# Patient Record
Sex: Female | Born: 1950 | Race: White | Hispanic: No | Smoking: Never smoker
Health system: Southern US, Community
[De-identification: ages and names within clinical notes are randomized; demographics above are authoritative.]

## PROBLEM LIST (undated history)

## (undated) DIAGNOSIS — I10 Essential (primary) hypertension: Secondary | ICD-10-CM

## (undated) HISTORY — PX: MYRINGOPLASTY: SUR873

## (undated) HISTORY — PX: OTHER SURGICAL HISTORY: SHX169

## (undated) HISTORY — PX: CEREBRAL EMBOLIZATION: SHX1327

## (undated) HISTORY — PX: HERNIA REPAIR: SHX51

---

## 1982-12-29 HISTORY — PX: DILATION AND CURETTAGE OF UTERUS: SHX78

## 2000-10-30 ENCOUNTER — Encounter: Admission: RE | Admit: 2000-10-30 | Discharge: 2001-01-28 | Payer: Self-pay | Admitting: Family Medicine

## 2004-02-16 ENCOUNTER — Encounter (INDEPENDENT_AMBULATORY_CARE_PROVIDER_SITE_OTHER): Payer: Self-pay | Admitting: *Deleted

## 2004-02-16 ENCOUNTER — Ambulatory Visit (HOSPITAL_COMMUNITY): Admission: RE | Admit: 2004-02-16 | Discharge: 2004-02-16 | Payer: Self-pay | Admitting: Gastroenterology

## 2004-12-26 ENCOUNTER — Other Ambulatory Visit: Admission: RE | Admit: 2004-12-26 | Discharge: 2004-12-26 | Payer: Self-pay | Admitting: Family Medicine

## 2006-01-02 ENCOUNTER — Other Ambulatory Visit: Admission: RE | Admit: 2006-01-02 | Discharge: 2006-01-02 | Payer: Self-pay | Admitting: Family Medicine

## 2007-01-22 ENCOUNTER — Other Ambulatory Visit: Admission: RE | Admit: 2007-01-22 | Discharge: 2007-01-22 | Payer: Self-pay | Admitting: Family Medicine

## 2008-02-04 ENCOUNTER — Other Ambulatory Visit: Admission: RE | Admit: 2008-02-04 | Discharge: 2008-02-04 | Payer: Self-pay | Admitting: Family Medicine

## 2009-02-09 ENCOUNTER — Other Ambulatory Visit: Admission: RE | Admit: 2009-02-09 | Discharge: 2009-02-09 | Payer: Self-pay | Admitting: Family Medicine

## 2010-02-22 ENCOUNTER — Other Ambulatory Visit: Admission: RE | Admit: 2010-02-22 | Discharge: 2010-02-22 | Payer: Self-pay | Admitting: Family Medicine

## 2010-11-15 ENCOUNTER — Encounter
Admission: RE | Admit: 2010-11-15 | Discharge: 2010-11-15 | Payer: Self-pay | Source: Home / Self Care | Admitting: Otolaryngology

## 2010-12-20 ENCOUNTER — Ambulatory Visit (HOSPITAL_COMMUNITY)
Admission: RE | Admit: 2010-12-20 | Discharge: 2010-12-20 | Payer: Self-pay | Source: Home / Self Care | Attending: Family Medicine | Admitting: Family Medicine

## 2010-12-27 ENCOUNTER — Ambulatory Visit (HOSPITAL_COMMUNITY)
Admission: RE | Admit: 2010-12-27 | Discharge: 2010-12-27 | Payer: Self-pay | Source: Home / Self Care | Attending: Interventional Radiology | Admitting: Interventional Radiology

## 2011-01-13 LAB — SURGICAL PCR SCREEN
MRSA, PCR: NEGATIVE
Staphylococcus aureus: POSITIVE — AB

## 2011-01-13 LAB — APTT: aPTT: 28 seconds (ref 24–37)

## 2011-01-13 LAB — BASIC METABOLIC PANEL
BUN: 11 mg/dL (ref 6–23)
CO2: 31 mEq/L (ref 19–32)
Calcium: 9.9 mg/dL (ref 8.4–10.5)
Chloride: 98 mEq/L (ref 96–112)
Creatinine, Ser: 0.81 mg/dL (ref 0.4–1.2)
GFR calc Af Amer: >60 mL/min (ref >60)
GFR calc non Af Amer: >60 mL/min (ref >60)
Glucose, Bld: 92 mg/dL (ref 70–99)
Potassium: 3.7 mEq/L (ref 3.5–5.1)
Sodium: 139 mEq/L (ref 135–145)

## 2011-01-13 LAB — CBC
HCT: 44 % (ref 36.0–46.0)
Hemoglobin: 14.6 g/dL (ref 12.0–15.0)
MCH: 28.1 pg (ref 26.0–34.0)
MCHC: 33.2 g/dL (ref 30.0–36.0)
MCV: 84.6 fL (ref 78.0–100.0)
Platelets: 278 10*3/uL (ref 150–400)
RBC: 5.2 MIL/uL — ABNORMAL HIGH (ref 3.87–5.11)
RDW: 13.5 % (ref 11.5–15.5)
WBC: 10.4 10*3/uL (ref 4.0–10.5)

## 2011-01-13 LAB — PROTIME-INR
INR: 1 (ref 0.00–1.49)
Prothrombin Time: 13.4 seconds (ref 11.6–15.2)

## 2011-01-15 ENCOUNTER — Ambulatory Visit (HOSPITAL_COMMUNITY)
Admission: RE | Admit: 2011-01-15 | Discharge: 2011-01-16 | Payer: Self-pay | Source: Home / Self Care | Attending: Interventional Radiology | Admitting: Interventional Radiology

## 2011-01-16 ENCOUNTER — Other Ambulatory Visit (HOSPITAL_COMMUNITY): Payer: Self-pay | Admitting: Interventional Radiology

## 2011-01-16 DIAGNOSIS — I770 Arteriovenous fistula, acquired: Secondary | ICD-10-CM

## 2011-01-20 LAB — CBC
HCT: 37.4 % (ref 36.0–46.0)
Hemoglobin: 12.3 g/dL (ref 12.0–15.0)
MCH: 27.6 pg (ref 26.0–34.0)
MCHC: 32.9 g/dL (ref 30.0–36.0)
MCV: 83.9 fL (ref 78.0–100.0)
Platelets: 234 10*3/uL (ref 150–400)
RBC: 4.46 MIL/uL (ref 3.87–5.11)
RDW: 13.6 % (ref 11.5–15.5)
WBC: 9.2 10*3/uL (ref 4.0–10.5)

## 2011-01-20 LAB — BASIC METABOLIC PANEL
BUN: 8 mg/dL (ref 6–23)
CO2: 28 mEq/L (ref 19–32)
Calcium: 9 mg/dL (ref 8.4–10.5)
Chloride: 102 mEq/L (ref 96–112)
Creatinine, Ser: 0.83 mg/dL (ref 0.4–1.2)
GFR calc Af Amer: >60 mL/min (ref >60)
GFR calc non Af Amer: >60 mL/min (ref >60)
Glucose, Bld: 116 mg/dL — ABNORMAL HIGH (ref 70–99)
Potassium: 4.2 mEq/L (ref 3.5–5.1)
Sodium: 143 mEq/L (ref 135–145)

## 2011-01-20 LAB — APTT: aPTT: 39 seconds — ABNORMAL HIGH (ref 24–37)

## 2011-01-20 LAB — PROTIME-INR
INR: 1.02 (ref 0.00–1.49)
Prothrombin Time: 13.6 seconds (ref 11.6–15.2)

## 2011-01-20 LAB — HEPARIN LEVEL (UNFRACTIONATED): Heparin Unfractionated: 0.17 IU/mL — ABNORMAL LOW (ref 0.30–0.70)

## 2011-01-20 NOTE — H&P (Signed)
Katie King, Katie King                 ACCOUNT NO.:  [id number]  MEDICAL RECORD NO.:  [id number]           PATIENT TYPE:  LOCATION:                                 FACILITY:  PHYSICIAN:  Lyana Asbill K. Satya Buttram, M.D.DATE OF BIRTH:  08/31/1951  DATE OF ADMISSION:  01/15/2011 DATE OF DISCHARGE:                             HISTORY & PHYSICAL   CHIEF COMPLAINT:  Dural AV fistula.  BRIEF HISTORY:  This is a pleasant 60 year old female who was referred to Dr. Corliss Skains through the courtesy of Dr. Suzanna Obey who had seen the patient for evaluation of right sided hearing loss.  The patient had a CT examination that showed a possible fistula.  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 She was seen in consultation by Dr. Corliss Skains on December 27, 2010.  At that time treatment options were discussed along with the risks, benefits, and alternatives.  The patient is admitted to [facility] today for embolization of the dural AV fistula.  PAST MEDICAL HISTORY:  Significant for hypertension.  The patient has had decreased hearing in her right ear recently and a sensation as though the ear was stopped up.  She has also noted some mild pulsatile tinnitus.  SURGICAL HISTORY:  The patient had a hernia repair at age 55.  She had a D&C 25 years ago.  ALLERGIES:  She is intolerant to CODEINE and SULFA secondary to nausea.  CURRENT MEDICATIONS:  The patient recently completed a course of azithromycin for a sinus infection.  She uses over-the-counter GenTeal eye drops.  She takes Tylenol p.r.n.  She takes hydrochlorothiazide 12.5 mg daily for hypertension, Claritin 10 mg daily p.r.n., amlodipine 10 mg daily, benazepril 20 mg daily, and she  was recently placed on aspirin.  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 Her husband is on disability from [place].  They have 1 daughter.  The patient does not smoke or use alcohol.  REVIEW OF SYSTEMS:  Negative except for the following.  The patient has had a nonproductive cough which she feels is secondary to a recent sinus infection, although the sinus infection has been treated with a course of Zithromax.  She has had the above-noted right ear problems with pulsatile tinnitus and a blocked sensation as well as decreased hearing in that ear.  The remainder of the review of systems was negative.  LABORATORY DATA:  An INR was 1.0.  Protime was 13.4, PTT was 28, BUN was 11, creatinine 0.81, potassium 3.7, GFR was greater than 60.  A presurgical screen was positive for staph aureus.  A CBC revealed hemoglobin 14.6, hematocrit 44, WBCs 10.4 thousand, platelets 278,000.  PHYSICAL EXAMINATION:  GENERAL:  Revealed a pleasant 60 year old white female in no acute distress. VITAL SIGNS:  Blood pressure 132/84, pulse 107, respirations 18, temperature 98.1, oxygen saturation 99% on room air. HEENT:  Unremarkable. HEART:  Revealed regular rate and rhythm without murmur. LUNGS:  Clear. ABDOMEN:  Soft, nontender. EXTREMITIES:  Revealed pulses to be intact without significant edema. Her airway was rated at a 1.  Her ASA scale was rated at a 2. NEUROLOGICAL:  Revealed the patient to be alert and oriented and able to follow 3-step commands.  Cranial nerves II-XII were grossly intact except for decreased hearing in the right ear.  Sensation was intact to light touch.  Motor strength is 5/5 throughout.  Cerebellar testing was intact.  IMPRESSION: 1. 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 2. History of decreased hearing in the right ear with pulsatile     tinnitus. 3. History of hypertension. 4. Positive staph aureus screen. 5. Recent sinus infection - treated. 6. Intolerance to codeine and sulfa drugs.  PLAN:  The patient will undergo endovascular embolization of the AV dural fistula today to be performed by Dr. Corliss Skains under general anesthesia.     Delton See, P.A.   ______________________________ Grandville Silos. Corliss Skains, M.D.    DR/MEDQ  D:  01/15/2011  T:  01/15/2011  Job:  [id number]  cc:   Caryn Bee L. Little, M.D. Suzanna Obey, M.D.  Electronically Signed by Delton See P.A. on 01/17/2011 12:13:22 PM Electronically Signed by Julieanne Cotton M.D. on 01/20/2011 11:23:05 AM

## 2011-01-23 LAB — POCT ACTIVATED CLOTTING TIME
Activated Clotting Time: 169 seconds
Activated Clotting Time: 164 seconds
Activated Clotting Time: 169 seconds
Activated Clotting Time: 169 seconds

## 2011-01-29 ENCOUNTER — Ambulatory Visit (HOSPITAL_COMMUNITY)
Admission: RE | Admit: 2011-01-29 | Discharge: 2011-01-29 | Disposition: A | Discharge status: Home / Self Care | Payer: 59 | Source: Ambulatory Visit | Attending: Interventional Radiology | Admitting: Interventional Radiology

## 2011-01-29 DIAGNOSIS — I770 Arteriovenous fistula, acquired: Secondary | ICD-10-CM

## 2011-01-29 NOTE — Discharge Summary (Signed)
Katie King, Katie King                 ACCOUNT NO.:  [id number]  MEDICAL RECORD NO.:  [id number]          PATIENT TYPE:  OIB  LOCATION:  3113                         FACILITY:  [facility]  PHYSICIAN:  Niasha Devins K. Abimael Zeiter, M.D.DATE OF BIRTH:  1951-07-28  DATE OF ADMISSION:  01/15/2011 DATE OF DISCHARGE:  01/16/2011                              DISCHARGE SUMMARY   CHIEF COMPLAINT:  Status post endovascular embolization for a dural AV fistula, performed on January 15, 2011.  BRIEF HISTORY:  This is a pleasant 59 year old female who was referred to Dr. Corliss Skains through the courtesy of Dr. Suzanna Obey who had evaluated the patient for hearing loss.  A CT scan revealed a possible fistula.  A cerebral angiogram was performed on December 20, 2010, and revealed a prominent fast-flowing dural AV fistula involving the left transverse sigmoid sinuses.  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  The patient was seen in consultation by Dr. Corliss Skains on December 27, 2010.  At that time, treatment options were discussed along with the risks, benefits, and alternatives.  The patient agreed to proceed and was admitted to [facility] on January 15, 2011, for intervention.  PAST MEDICAL HISTORY:  Significant for: 1. Hypertension. 2. Decreased hearing in her right ear and sensation of fullness in her     right ear with mild pulsatile tinnitus.  PAST SURGICAL HISTORY:  The patient had a hernia repair at age 23.  She had a D and C 25 years ago.  ALLERGIES:  She is intolerant to CODEINE and SULFA secondary to nausea. She has a sensitivity to LATEX.  MEDICATIONS: 1. Amlodipine 10 mg daily. 2. Benazepril 20 mg daily. 3. Loratadine 10 mg p.r.n. 4. Hydrochlorothiazide 12.5 mg daily. 5. Tylenol p.r.n. 6. GenTeal eyedrops over the counter p.r.n. 7. Refresh Tears over the counter p.r.n. 8. She patient  recently finished a course of azithromycin 250 mg daily.   9. She received nimodipine and aspirin on the day of the procedure.  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  They have one daughter.  The patient does not smoke or use alcohol.  She lives in [place].  HOSPITAL COURSE:  This patient was admitted to [facility] on January 15, 2011, to undergo endovascular embolization of a dural AV fistula.  The procedure was performed on the day of admission by Dr. Corliss Skains under general anesthesia using the Onyx embolization method. Dr. Corliss Skains felt that there was an excellent result, and there were no obvious or immediate complications.  He did feel that the patient might require further treatment in the future;  however, there is also a possibility that this will be the only treatment required.  Following the intervention, the patient was admitted to the neuro intensive care unit where she remained stable overnight.  The following day, the patient was doing well.  She reported that she was no longer hearing the pulsatile tinnitus. The hearing had improved somewhat  in her right ear although it was still decreased compared to the left.  Dr. Corliss Skains recommended a cerebral angiogram in 3 months with intent to possibly treat further; although, he would not know whether or not further treatment was required until the angiogram was performed. The patient was discharged later that day in stable and improved condition.  LABORATORY DATA:  A CBC on the day of discharge revealed hemoglobin 12.3, hematocrit 37.4, WBCs were 9.2 thousand, platelets 234,000.  A basic metabolic panel revealed a BUN of 8, creatinine 0.83, potassium was 4.2, glucose was 116, GFR was greater than 60.  A chest x-ray performed prior to the intervention showed no acute or worrisome focal cardiopulmonary  abnormalities.  DISCHARGE MEDICATIONS:  The patient was discharged on the same medication she was on at the time of admission, please see the list as noted above.  The patient was given instructions regarding wound care.  She was told not to do anything strenuous for at least 2 weeks.  She was not to drive or return to work for at least 2 weeks.  The patient will follow up with Dr. Corliss Skains in 2 weeks.  She will see Dr. Catha Gosselin as needed or as scheduled.  DISCHARGE DIAGNOSES: 1. 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  The plan was for this      to be a staged procedure; however, further treatment may not be required. 2. History of decreased hearing in the right ear as well as possible     tinnitus which has somewhat improved following treatment. 3. History of hypertension. 4. Positive staph aureus screen at the time of admission which was     treated. 5. Recent sinus infection. 6. Intolerance to CODEINE and SULFA drugs as well as a sensitivity to     LATEX.  PLAN:  As noted, a cerebral angiogram will be performed in 3 months with possible further treatment.     Delton See, P.A.   ______________________________ Grandville Silos. Corliss Skains, M.D.    DR/MEDQ  D:  01/17/2011  T:  01/18/2011  Job:  [id number]  cc:   Suzanna Obey, M.D. Anna Genre Little, M.D.  Electronically Signed by Delton See P.A. on 01/21/2011 09:34:13 AM Electronically Signed by Julieanne Cotton M.D. on 01/29/2011 03:26:53 PM

## 2011-03-10 LAB — POCT I-STAT, CHEM 8
BUN: 18 mg/dL (ref 6–23)
Chloride: 103 mEq/L (ref 96–112)
Creatinine, Ser: 0.8 mg/dL (ref 0.4–1.2)
Hemoglobin: 15.3 g/dL — ABNORMAL HIGH (ref 12.0–15.0)
Potassium: 3.5 mEq/L (ref 3.5–5.1)
Sodium: 140 mEq/L (ref 135–145)

## 2011-03-10 LAB — CBC
HCT: 44.4 % (ref 36.0–46.0)
Hemoglobin: 15 g/dL (ref 12.0–15.0)
MCH: 28.6 pg (ref 26.0–34.0)
MCHC: 33.8 g/dL (ref 30.0–36.0)
MCV: 84.6 fL (ref 78.0–100.0)
RBC: 5.25 MIL/uL — ABNORMAL HIGH (ref 3.87–5.11)

## 2011-03-10 LAB — PROTIME-INR: Prothrombin Time: 13.1 seconds (ref 11.6–15.2)

## 2011-03-17 ENCOUNTER — Other Ambulatory Visit (HOSPITAL_COMMUNITY): Payer: Self-pay | Admitting: Interventional Radiology

## 2011-03-17 DIAGNOSIS — I770 Arteriovenous fistula, acquired: Secondary | ICD-10-CM

## 2011-03-28 ENCOUNTER — Other Ambulatory Visit (HOSPITAL_COMMUNITY)
Admission: RE | Admit: 2011-03-28 | Discharge: 2011-03-28 | Disposition: A | Discharge status: Home / Self Care | Payer: 59 | Source: Ambulatory Visit | Attending: Family Medicine | Admitting: Family Medicine

## 2011-03-28 ENCOUNTER — Other Ambulatory Visit: Payer: Self-pay | Admitting: Family Medicine

## 2011-03-28 DIAGNOSIS — Z124 Encounter for screening for malignant neoplasm of cervix: Secondary | ICD-10-CM | POA: Insufficient documentation

## 2011-03-28 DIAGNOSIS — Z1159 Encounter for screening for other viral diseases: Secondary | ICD-10-CM | POA: Insufficient documentation

## 2011-04-18 ENCOUNTER — Encounter (HOSPITAL_COMMUNITY)
Admission: RE | Admit: 2011-04-18 | Discharge: 2011-04-18 | Disposition: A | Discharge status: Home / Self Care | Payer: 59 | Source: Ambulatory Visit | Attending: Interventional Radiology | Admitting: Interventional Radiology

## 2011-04-18 LAB — APTT: aPTT: 24 seconds (ref 24–37)

## 2011-04-18 LAB — SURGICAL PCR SCREEN: Staphylococcus aureus: NEGATIVE

## 2011-04-18 LAB — BASIC METABOLIC PANEL
GFR calc non Af Amer: >60 mL/min (ref >60)
Potassium: 4.5 mEq/L (ref 3.5–5.1)
Sodium: 141 mEq/L (ref 135–145)

## 2011-04-18 LAB — DIFFERENTIAL
Basophils Absolute: 0.1 10*3/uL (ref 0.0–0.1)
Lymphocytes Relative: 28 % (ref 12–46)
Lymphs Abs: 2.2 10*3/uL (ref 0.7–4.0)
Monocytes Absolute: 0.4 10*3/uL (ref 0.1–1.0)
Neutro Abs: 5 10*3/uL (ref 1.7–7.7)

## 2011-04-18 LAB — CBC
HCT: 43.9 % (ref 36.0–46.0)
Hemoglobin: 14.7 g/dL (ref 12.0–15.0)
MCHC: 33.5 g/dL (ref 30.0–36.0)
MCV: 83.6 fL (ref 78.0–100.0)

## 2011-04-18 LAB — PROTIME-INR: INR: 0.97 (ref 0.00–1.49)

## 2011-04-23 ENCOUNTER — Inpatient Hospital Stay (HOSPITAL_COMMUNITY)
Admission: AD | Admit: 2011-04-23 | Discharge: 2011-04-24 | DRG: 027 | Disposition: A | Discharge status: Home / Self Care | Payer: 59 | Source: Ambulatory Visit | Attending: Interventional Radiology | Admitting: Interventional Radiology

## 2011-04-23 ENCOUNTER — Ambulatory Visit (HOSPITAL_COMMUNITY)
Admission: RE | Admit: 2011-04-23 | Discharge: 2011-04-23 | Disposition: A | Discharge status: Home / Self Care | Payer: 59 | Source: Ambulatory Visit | Attending: Interventional Radiology | Admitting: Interventional Radiology

## 2011-04-23 DIAGNOSIS — I770 Arteriovenous fistula, acquired: Secondary | ICD-10-CM

## 2011-04-23 DIAGNOSIS — M129 Arthropathy, unspecified: Secondary | ICD-10-CM | POA: Diagnosis present

## 2011-04-23 DIAGNOSIS — I729 Aneurysm of unspecified site: Secondary | ICD-10-CM

## 2011-04-23 DIAGNOSIS — I671 Cerebral aneurysm, nonruptured: Principal | ICD-10-CM | POA: Diagnosis present

## 2011-04-23 DIAGNOSIS — E669 Obesity, unspecified: Secondary | ICD-10-CM | POA: Diagnosis present

## 2011-04-23 DIAGNOSIS — I10 Essential (primary) hypertension: Secondary | ICD-10-CM | POA: Diagnosis present

## 2011-04-23 MED ORDER — IOHEXOL 300 MG/ML  SOLN: 210.0000 mL | Freq: Once | INTRAMUSCULAR | Status: AC | PRN

## 2011-04-24 LAB — BASIC METABOLIC PANEL
CO2: 28 mEq/L (ref 19–32)
Calcium: 8.6 mg/dL (ref 8.4–10.5)
Creatinine, Ser: 0.69 mg/dL (ref 0.4–1.2)
GFR calc Af Amer: >60 mL/min (ref >60)
GFR calc non Af Amer: >60 mL/min (ref >60)
Glucose, Bld: 110 mg/dL — ABNORMAL HIGH (ref 70–99)
Sodium: 140 mEq/L (ref 135–145)

## 2011-04-24 LAB — POCT ACTIVATED CLOTTING TIME: Activated Clotting Time: 169 seconds

## 2011-04-24 LAB — CBC
Hemoglobin: 11.5 g/dL — ABNORMAL LOW (ref 12.0–15.0)
MCH: 27.8 pg (ref 26.0–34.0)
MCHC: 32.7 g/dL (ref 30.0–36.0)
RDW: 13.8 % (ref 11.5–15.5)

## 2011-04-29 NOTE — H&P (Signed)
Katie King, Katie King                 ACCOUNT NO.:  [id number]  MEDICAL RECORD NO.:  [id number]           PATIENT TYPE:  O  LOCATION:  SDSC                         FACILITY:  [facility]  PHYSICIAN:  Nari Vannatter K. Jemima Petko, M.D.DATE OF BIRTH:  [birth year]  DATE OF ADMISSION:  04/23/2011 DATE OF DISCHARGE:                             HISTORY & PHYSICAL   CHIEF COMPLAINT:  Dural AV fistula.  BRIEF HISTORY:  This is a pleasant 60 year old female referred to Dr. Corliss Skains through the courtesy of Dr. Suzanna Obey after the patient was evaluated for hearing loss.  A CT scan showed a possible fistula.  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  Dr. Corliss Skains met with the patient in consultation on December 27, 2010, at which time treatment options were discussed along with the risks and benefits.  The patient underwent partial embolization of the fistula on January 15, 2011.  A decision was made to have the patient return on April 23, 2011, for a repeat cerebral angiogram and possible further embolization.  PAST MEDICAL HISTORY:  Significant for: 1. Hypertension. 2. Decreased hearing in her right ear with a sensation of fullness in     that ear canal as well as pulsatile tinnitus.  PAST SURGICAL HISTORY:  Significant for a hernia repair at age 72 as well as a D and C approximately 25 years ago.  ALLERGIES:  The patient is intolerant to CODEINE and SULFA secondary to nausea.  She is also sensitive to LATEX.  ADMISSION MEDICATIONS:  The patient is on hydrochlorothiazide, benazepril, amlodipine, Claritin, eyedrops, betamethasone cream, and fish oil.  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 Her husband is on  disability secondary to multiple sclerosis.  They have one daughter.  The patient does not use alcohol or tobacco.  She lives in [place].  REVIEW OF SYSTEMS:  The review of systems today was completely negative except for a continued fullness in her right ear and mild pulsatile tinnitus.  LABORATORY DATA:  An INR was 0.97.  A pro time was 13.1, a PTT was 24. BUN was 12, creatinine 0.72, potassium 4.5, GFR greater than 60, chloride 33.  Hemoglobin 14.7, hematocrit 43.9, WBCs 8.1 thousand, platelets 249,000.  PHYSICAL EXAMINATION:  GENERAL:  A pleasant 60 year old Caucasian female, in no acute distress.  Her airway was rated at 1, her ASA scale was a 3. VITAL SIGNS:  Blood pressure 116/76, pulse 69, respirations 14, temperature 98.1, oxygen saturation 95% on room air. HEENT:  Unremarkable. HEART:  Regular rate and rhythm. LUNGS:  Clear. ABDOMEN:  Soft, nontender. EXTREMITIES:  Trace edema.  Her dorsalis pedis pulses were intact. NEUROLOGIC:  Alert and oriented and able to follow three-step commands. Cranial nerves II through XII grossly intact.  Sensation was intact to light touch.  Motor strength is 5/5.  Cerebellar testing was intact.  IMPRESSION: 1. Status  post endovascular embolization of a dural arteriovenous     fistula, performed on January 15, 2011. 2. Plans for further embolization of the dural arteriovenous fistula     to be performed today, April 23, 2011. 3. History of hypertension. 4. History of decreased hearing and pulsatile tinnitus in the right     ear. 5. Sensitivity to LATEX as well as intolerance to CODEINE and SULFA.     Delton See, P.A.   ______________________________ Grandville Silos. Corliss Skains, M.D.    DR/MEDQ  D:  04/23/2011  T:  04/23/2011  Job:  [id number]  cc:   Caryn Bee L. Little, M.D. Suzanna Obey, M.D.  Electronically Signed by Delton See P.A. on 04/23/2011 12:39:29 PM Electronically Signed by Julieanne Cotton M.D. on 04/29/2011 02:39:08  PM

## 2011-05-12 ENCOUNTER — Other Ambulatory Visit (HOSPITAL_COMMUNITY): Payer: 59

## 2011-05-12 NOTE — Discharge Summary (Signed)
Katie King, Katie King                 ACCOUNT NO.:  [id number]  MEDICAL RECORD NO.:  [id number]           PATIENT TYPE:  I  LOCATION:  3113                         FACILITY:  [facility]  PHYSICIAN:  Ofelia Podolski K. Hanako Tipping, M.D.DATE OF BIRTH:  09/25/1951  DATE OF ADMISSION:  04/23/2011 DATE OF DISCHARGE:  04/24/2011                              DISCHARGE SUMMARY   CHIEF COMPLAINT:  Status post embolization of dural AV fistula.  BRIEF HISTORY:  This is a pleasant 60 year old female referred to Dr. Corliss Skains through the courtesy of Dr. Suzanna Obey after the patient was evaluated for hearing loss.  A CT scan showed a possible fistula.  The patient had a cerebral angiogram on December 20, 2010, that revealed a prominent fast-flowing dural AV fistula involving the left transverse sigmoid sinuses.  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  Dr. Corliss Skains met with the patient in consultation on December 27, 2010, at which time treatment options were discussed along with the risks and benefits.  The patient underwent partial embolization of the fistula on January 15, 2011.  She returned on April 23, 2011, for further embolization.  PAST MEDICAL HISTORY:  Significant for hypertension as well as decreased hearing in her right ear with a sensation of fullness in that ear canal associated with pulsatile tinnitus.  PAST SURGICAL HISTORY:  Significant for a hernia repair at age 73.  She had a D and C approximately 25 years ago.  ALLERGIES:  The patient is intolerant to CODEINE and SULFA secondary to nausea.  She is sensitive to LATEX.  ADMISSION MEDICATIONS:  Hydrochlorothiazide, benazepril, amlodipine, Claritin, eyedrops, betamethasone cream, and fish oil.  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  They have one  daughter.  The patient does not use alcohol or tobacco.  She lives in [place].  HOSPITAL COURSE:  This patient was admitted to [facility] on April 23, 2011, for further embolization of a dural AV fistula with initial treatment having been performed on January 15, 2011.  The fistula was further embolized on the day of admission under general anesthesia by Dr. Corliss Skains using Onyx as well as coiling.  She tolerated this well.  The patient was admitted to the neuro intensive care unit overnight where she remained stable.  The following day the patient was doing well.  She felt that the pulsatile tinnitus had decreased in intensity.  She also felt that her right ear canal did not feel as full as previously.  Arrangements were made to discharge the patient later that day in stable and improved condition.  DISCHARGE MEDICATIONS:  The patient was told to continue the same medication she was on prior to admission.  She was given instructions regarding wound care.  She was told to resume her normal diet.  She was not to drive for 1 week.  She was not to return to work for 2 weeks.  She was told to avoid any strenuous activity for 2  weeks.  She will follow up with Dr. Corliss Skains on Monday, May 14 at 3:00 p.m.  She will see Dr. Jearld Fenton and Dr. Clarene Duke as scheduled.  DISCHARGE DIAGNOSES: 1. Status post staged embolization of a prominent fast-flowing dural     arteriovenous fistula involving the left transverse and sigmoid     sinuses. 2. History of hypertension. 3. History of decreased hearing in the right ear with a sensation of     fullness and pulsatile tinnitus.     Delton See, P.A.   ______________________________ Grandville Silos. Corliss Skains, M.D.    DR/MEDQ  D:  04/25/2011  T:  04/26/2011  Job:  [id number]  cc:   Caryn Bee L. Little, M.D. Suzanna Obey, M.D.  Electronically Signed by Delton See P.A. on 05/08/2011 10:43:39 AM Electronically Signed by Julieanne Cotton M.D. on  05/12/2011 11:52:36 AM

## 2011-05-14 ENCOUNTER — Ambulatory Visit (HOSPITAL_COMMUNITY)
Admit: 2011-05-14 | Discharge: 2011-05-14 | Disposition: A | Discharge status: Home / Self Care | Payer: 59 | Attending: Interventional Radiology | Admitting: Interventional Radiology

## 2011-05-16 NOTE — Op Note (Signed)
NAME:  Katie King, Katie King                           ACCOUNT NO.:  [id number]   MEDICAL RECORD NO.:  [id number]                   PATIENT TYPE:  AMB   LOCATION:  ENDO                                 FACILITY:  [facility]   PHYSICIAN:  John C. Madilyn Fireman, M.D.                 DATE OF BIRTH:  10-Jul-1951   DATE OF PROCEDURE:  DATE OF DISCHARGE:                                 OPERATIVE REPORT   INDICATIONS FOR PROCEDURE:  Colon cancer screening in a 60 year old patient  with no prior colon screening.   PROCEDURE:  The patient was placed in the left lateral decubitus position  and placed on a pulse monitor with continuous low flow oxygen delivered by  nasal cannula.  She was sedated with 75 mcg IV fentanyl and 8 mg IV Versed.  The video colonoscope was inserted into the rectum and advanced to the  cecum.  Confirmed by transillumination of McBurney's point and visualization  of the ileocecal valve and appendiceal orifice.  The prep was good.  The  cecum appeared normal with the exception of a 7 mm polyp in the base, which  was removed by hot biopsy.  In the ascending colon, there was seen a 1 cm  sessile, pedunculated polyp, which was removed by snare.  The remainder of  the ascending, transverse, descending, sigmoid, and rectum all appeared  normal with no further polyps, masses, diverticula, or other mucosal  abnormalities.  The scope is then withdrawn, and the patient returned to the  recovery room in stable condition.  He tolerated the procedure well.  There  were no immediate complications.   IMPRESSION:  Two colon polyps, otherwise normal study.   PLAN:  Await histology to determine method and interval for future colon  screening.                                               John C. Madilyn Fireman, M.D.    JCH/MEDQ  D:  02/16/2004  T:  02/16/2004  Job:  [id number]   cc:   Caryn Bee L. Little, M.D.  [address]  [place]  [place] [address]  Fax: [phone]

## 2011-07-25 ENCOUNTER — Other Ambulatory Visit (HOSPITAL_COMMUNITY): Payer: Self-pay | Admitting: Interventional Radiology

## 2011-07-25 DIAGNOSIS — I770 Arteriovenous fistula, acquired: Secondary | ICD-10-CM

## 2011-08-22 ENCOUNTER — Encounter (HOSPITAL_COMMUNITY)
Admission: RE | Admit: 2011-08-22 | Discharge: 2011-08-22 | Disposition: A | Discharge status: Home / Self Care | Payer: 59 | Source: Ambulatory Visit | Attending: Interventional Radiology | Admitting: Interventional Radiology

## 2011-08-22 LAB — PROTIME-INR
INR: 0.93 (ref 0.00–1.49)
Prothrombin Time: 12.7 seconds (ref 11.6–15.2)

## 2011-08-22 LAB — DIFFERENTIAL
Eosinophils Absolute: 0.5 10*3/uL (ref 0.0–0.7)
Eosinophils Relative: 7 % — ABNORMAL HIGH (ref 0–5)
Lymphocytes Relative: 29 % (ref 12–46)
Lymphs Abs: 2 10*3/uL (ref 0.7–4.0)
Monocytes Absolute: 0.4 10*3/uL (ref 0.1–1.0)

## 2011-08-22 LAB — SURGICAL PCR SCREEN
MRSA, PCR: NEGATIVE
Staphylococcus aureus: NEGATIVE

## 2011-08-22 LAB — BASIC METABOLIC PANEL
Calcium: 10.1 mg/dL (ref 8.4–10.5)
GFR calc Af Amer: >60 mL/min (ref >60)
GFR calc non Af Amer: >60 mL/min (ref >60)
Potassium: 4 mEq/L (ref 3.5–5.1)
Sodium: 142 mEq/L (ref 135–145)

## 2011-08-22 LAB — CBC: MCH: 28 pg (ref 26.0–34.0)

## 2011-08-22 LAB — APTT: aPTT: 27 seconds (ref 24–37)

## 2011-08-28 ENCOUNTER — Ambulatory Visit (HOSPITAL_COMMUNITY)
Admission: RE | Admit: 2011-08-28 | Discharge: 2011-08-28 | Disposition: A | Discharge status: Home / Self Care | Payer: 59 | Source: Ambulatory Visit | Attending: Interventional Radiology | Admitting: Interventional Radiology

## 2011-08-28 DIAGNOSIS — Z01812 Encounter for preprocedural laboratory examination: Secondary | ICD-10-CM | POA: Insufficient documentation

## 2011-08-28 DIAGNOSIS — I10 Essential (primary) hypertension: Secondary | ICD-10-CM | POA: Insufficient documentation

## 2011-08-28 DIAGNOSIS — Z9889 Other specified postprocedural states: Secondary | ICD-10-CM | POA: Insufficient documentation

## 2011-08-28 DIAGNOSIS — I671 Cerebral aneurysm, nonruptured: Secondary | ICD-10-CM | POA: Insufficient documentation

## 2011-08-28 DIAGNOSIS — I770 Arteriovenous fistula, acquired: Secondary | ICD-10-CM

## 2011-08-28 MED ORDER — IOHEXOL 300 MG/ML  SOLN: 150.0000 mL | Freq: Once | INTRAMUSCULAR | Status: AC | PRN

## 2011-11-24 ENCOUNTER — Other Ambulatory Visit (HOSPITAL_COMMUNITY): Payer: Self-pay | Admitting: Interventional Radiology

## 2011-11-24 DIAGNOSIS — I770 Arteriovenous fistula, acquired: Secondary | ICD-10-CM

## 2011-12-10 ENCOUNTER — Other Ambulatory Visit: Payer: Self-pay | Admitting: Neurology

## 2011-12-11 ENCOUNTER — Encounter (HOSPITAL_COMMUNITY)
Admission: RE | Admit: 2011-12-11 | Discharge: 2011-12-11 | Disposition: A | Discharge status: Home / Self Care | Payer: 59 | Source: Ambulatory Visit | Attending: Interventional Radiology | Admitting: Interventional Radiology

## 2011-12-11 ENCOUNTER — Encounter (HOSPITAL_COMMUNITY): Payer: Self-pay

## 2011-12-11 HISTORY — DX: Essential (primary) hypertension: I10

## 2011-12-11 LAB — CBC
MCH: 28.5 pg (ref 26.0–34.0)
MCHC: 33.8 g/dL (ref 30.0–36.0)
MCV: 84.3 fL (ref 78.0–100.0)
Platelets: 253 10*3/uL (ref 150–400)
RDW: 14.1 % (ref 11.5–15.5)

## 2011-12-11 LAB — DIFFERENTIAL
Basophils Absolute: 0 10*3/uL (ref 0.0–0.1)
Basophils Relative: 1 % (ref 0–1)
Eosinophils Absolute: 0.3 10*3/uL (ref 0.0–0.7)
Eosinophils Relative: 3 % (ref 0–5)
Lymphs Abs: 3.3 10*3/uL (ref 0.7–4.0)
Neutrophils Relative %: 46 % (ref 43–77)

## 2011-12-11 LAB — BASIC METABOLIC PANEL
BUN: 26 mg/dL — ABNORMAL HIGH (ref 6–23)
CO2: 31 mEq/L (ref 19–32)
Calcium: 9.4 mg/dL (ref 8.4–10.5)
Creatinine, Ser: 1.34 mg/dL — ABNORMAL HIGH (ref 0.50–1.10)
GFR calc non Af Amer: 43 mL/min — ABNORMAL LOW (ref >90)
Glucose, Bld: 106 mg/dL — ABNORMAL HIGH (ref 70–99)
Sodium: 140 mEq/L (ref 135–145)

## 2011-12-11 LAB — PROTIME-INR
INR: 1.06 (ref 0.00–1.49)
Prothrombin Time: 14 seconds (ref 11.6–15.2)

## 2011-12-11 NOTE — Pre-Procedure Instructions (Signed)
20 Katie King  12/11/2011   Your procedure is scheduled on:  Dec 17, 2011  Report to [facility] at 0600 AM.  Call this number if you have problems the morning of surgery: [phone]   Remember:   Do not eat food:After Midnight.  May have clear liquids: up to 4 Hours before arrival.  Clear liquids include soda, tea, black coffee, apple or grape juice, broth.  Take these medicines the morning of surgery with A SIP OF WATER: norvasc, claritin    Do not wear jewelry, make-up or nail polish.  Do not wear lotions, powders, or perfumes. You may wear deodorant.  Do not shave 48 hours prior to surgery.  Do not bring valuables to the hospital.  Contacts, dentures or bridgework may not be worn into surgery.  Leave suitcase in the car. After surgery it may be brought to your room.  For patients admitted to the hospital, checkout time is 11:00 AM the day of discharge.   Patients discharged the day of surgery will not be allowed to drive home.  Name and phone number of your driver: April Pergram  Special Instructions: CHG Shower Use Special Wash: 1/2 bottle night before surgery and 1/2 bottle morning of surgery.   Please read over the following fact sheets that you were given: Pain Booklet and Surgical Site Infection Prevention

## 2011-12-12 ENCOUNTER — Other Ambulatory Visit (HOSPITAL_COMMUNITY): Payer: 59

## 2011-12-12 ENCOUNTER — Other Ambulatory Visit: Payer: Self-pay | Admitting: Neurology

## 2011-12-12 NOTE — Progress Notes (Signed)
Will send chart to anesthesia for of labs.

## 2011-12-12 NOTE — Consult Note (Signed)
Anesthesia:  Patient is a 60 year old female scheduled for a cerebral angiogram and endovascular embolization due to a dural AVF.  She has undergone similar procedures at [facility] earlier this year on 01/15/11 and 04/23/11.  History includes HTN.  Her EKG from 01/10/11 showed NSR and CXR showed no worrisome findings.  I was asked to review her preoperative labs which show a mildly decreased K of 3.4.  Her BUN/Cr are mildly elevated now at 26/1.34 (up from 15/0.77 on 08/22/11).  She is on benazepril and HCTZ.  I called these results to Delton See, PA-C.  Defer any additional orders to him or Dr. Corliss Skains.

## 2011-12-17 ENCOUNTER — Encounter (HOSPITAL_COMMUNITY): Payer: Self-pay | Admitting: Vascular Surgery

## 2011-12-17 ENCOUNTER — Encounter (HOSPITAL_COMMUNITY): Payer: Self-pay

## 2011-12-17 ENCOUNTER — Ambulatory Visit (HOSPITAL_COMMUNITY)
Admission: RE | Admit: 2011-12-17 | Discharge: 2011-12-17 | Disposition: A | Discharge status: Home / Self Care | Payer: 59 | Source: Ambulatory Visit | Attending: Interventional Radiology | Admitting: Interventional Radiology

## 2011-12-17 ENCOUNTER — Other Ambulatory Visit (HOSPITAL_COMMUNITY): Payer: Self-pay | Admitting: Interventional Radiology

## 2011-12-17 VITALS — BP 138/83 | HR 91 | Temp 97.6°F | Resp 20

## 2011-12-17 DIAGNOSIS — I770 Arteriovenous fistula, acquired: Secondary | ICD-10-CM

## 2011-12-17 DIAGNOSIS — I6509 Occlusion and stenosis of unspecified vertebral artery: Secondary | ICD-10-CM | POA: Insufficient documentation

## 2011-12-17 DIAGNOSIS — Z01812 Encounter for preprocedural laboratory examination: Secondary | ICD-10-CM | POA: Insufficient documentation

## 2011-12-17 DIAGNOSIS — R51 Headache: Secondary | ICD-10-CM | POA: Insufficient documentation

## 2011-12-17 DIAGNOSIS — H9319 Tinnitus, unspecified ear: Secondary | ICD-10-CM | POA: Insufficient documentation

## 2011-12-17 LAB — BASIC METABOLIC PANEL
CO2: 26 mEq/L (ref 19–32)
GFR calc non Af Amer: >90 mL/min (ref >90)
Glucose, Bld: 122 mg/dL — ABNORMAL HIGH (ref 70–99)
Potassium: 3.6 mEq/L (ref 3.5–5.1)
Sodium: 140 mEq/L (ref 135–145)

## 2011-12-17 LAB — APTT: aPTT: 25 seconds (ref 24–37)

## 2011-12-17 LAB — PROTIME-INR: INR: 1.02 (ref 0.00–1.49)

## 2011-12-17 MED ORDER — IOHEXOL 300 MG/ML  SOLN
150.0000 mL | Freq: Once | INTRAMUSCULAR | Status: AC | PRN
  Administered 2011-12-17: 72 mL via INTRA_ARTERIAL

## 2011-12-17 MED ORDER — PROMETHAZINE HCL 25 MG/ML IJ SOLN: 6.2500 mg | INTRAMUSCULAR | Status: AC | PRN

## 2011-12-17 MED ORDER — ACETAMINOPHEN 325 MG PO TABS
650.0000 mg | ORAL_TABLET | Freq: Once | ORAL | Status: AC
  Administered 2011-12-17: 650 mg via ORAL

## 2011-12-17 MED ORDER — FENTANYL CITRATE 0.05 MG/ML IJ SOLN
INTRAMUSCULAR | Status: DC | PRN
  Administered 2011-12-17: 25 ug via INTRAVENOUS

## 2011-12-17 MED ORDER — HEPARIN SODIUM (PORCINE) 1000 UNIT/ML IJ SOLN
INTRAMUSCULAR | Status: DC | PRN
  Administered 2011-12-17 (×2): 500 [IU] via INTRAVENOUS

## 2011-12-17 MED ORDER — MIDAZOLAM HCL 5 MG/5ML IJ SOLN
INTRAMUSCULAR | Status: DC | PRN
  Administered 2011-12-17: 1 mg via INTRAVENOUS

## 2011-12-17 MED ORDER — ACETAMINOPHEN 325 MG PO TABS
ORAL_TABLET | ORAL | Status: AC
  Administered 2011-12-17: 650 mg via ORAL
  Filled 2011-12-17: qty 2

## 2011-12-17 MED ORDER — NIMODIPINE 30 MG PO CAPS
60.0000 mg | ORAL_CAPSULE | ORAL | Status: AC
  Administered 2011-12-17: 60 mg via ORAL

## 2011-12-17 MED ORDER — ASPIRIN EC 325 MG PO TBEC
325.0000 mg | DELAYED_RELEASE_TABLET | Freq: Once | ORAL | Status: AC
  Administered 2011-12-17: 325 mg via ORAL

## 2011-12-17 MED ORDER — SODIUM CHLORIDE 0.9 % IV SOLN
INTRAVENOUS | Status: DC | PRN
  Administered 2011-12-17: 09:00:00 via INTRAVENOUS

## 2011-12-17 MED ORDER — CEFAZOLIN SODIUM 1-5 GM-% IV SOLN: 1.0000 g | INTRAVENOUS | Status: DC

## 2011-12-17 MED ORDER — SODIUM CHLORIDE 0.9 % IV SOLN
INTRAVENOUS | Status: DC
  Administered 2011-12-17: 09:00:00 via INTRAVENOUS

## 2011-12-17 MED ORDER — SODIUM CHLORIDE 0.9 % IV SOLN: INTRAVENOUS | Status: DC

## 2011-12-17 MED ORDER — ONDANSETRON HCL 4 MG/2ML IJ SOLN
INTRAMUSCULAR | Status: DC | PRN
  Administered 2011-12-17: 4 mg via INTRAVENOUS

## 2011-12-17 MED ORDER — IOHEXOL 300 MG/ML  SOLN
72.0000 mL | Freq: Once | INTRAMUSCULAR | Status: AC | PRN
  Administered 2011-12-17: 72 mL via INTRAVENOUS

## 2011-12-17 MED ORDER — FENTANYL CITRATE 0.05 MG/ML IJ SOLN: 25.0000 ug | INTRAMUSCULAR | Status: AC | PRN

## 2011-12-17 MED ORDER — SODIUM CHLORIDE 0.9 % IV SOLN: INTRAVENOUS | Status: AC

## 2011-12-17 NOTE — Anesthesia Postprocedure Evaluation (Signed)
  Anesthesia Post-op Note  Patient: Katie King  Procedure(s) Performed: * No procedures listed *  Patient Location: PACU  Anesthesia Type: MAC  Level of Consciousness: awake  Airway and Oxygen Therapy: Patient Spontanous Breathing  Post-op Pain: mild  Post-op Assessment: Post-op Vital signs reviewed  Post-op Vital Signs: stable  Complications: No apparent anesthesia complications

## 2011-12-17 NOTE — ED Notes (Signed)
Family updated as to patient's status.Spoke with pt's daughter, Delorice.  Advised started procedure about 2 mins ago.  Questions denied at this time.

## 2011-12-17 NOTE — ED Notes (Signed)
All VS, Stewart Scale Assessments and Cardiac Monitor evaluations per anesthesia.

## 2011-12-17 NOTE — Anesthesia Preprocedure Evaluation (Addendum)
Anesthesia Evaluation  Patient identified by MRN, date of birth, ID band Patient awake    Reviewed: Allergy & Precautions, H&P , NPO status , Patient's Chart, lab work & pertinent test results  Airway Mallampati: I TM Distance: <3 FB Neck ROM: Full    Dental  (+) Teeth Intact and Dental Advisory Given   Pulmonary neg pulmonary ROS,          Cardiovascular hypertension, Pt. on medications     Neuro/Psych Negative Neurological ROS     GI/Hepatic negative GI ROS, Neg liver ROS,   Endo/Other  Negative Endocrine ROS  Renal/GU negative Renal ROS     Musculoskeletal negative musculoskeletal ROS (+)   Abdominal   Peds  Hematology negative hematology ROS (+)   Anesthesia Other Findings   Reproductive/Obstetrics                          Anesthesia Physical Anesthesia Plan  ASA: III  Anesthesia Plan: MAC   Post-op Pain Management:    Induction: Intravenous  Airway Management Planned: Mask  Additional Equipment:   Intra-op Plan:   Post-operative Plan: Extubation in OR  Informed Consent:   Plan Discussed with: CRNA  Anesthesia Plan Comments:         Anesthesia Quick Evaluation

## 2011-12-17 NOTE — Procedures (Signed)
S/P 4 vessel cerebral arteriogram. Rt CFA approach. Preliminary findings. 1.. I interval slow flow in previously emboli sed posterior fossa dural AVF.Katie King

## 2011-12-17 NOTE — Progress Notes (Signed)
DAVE RINEHULS,PA NOTIFIED OF O2 SAT 89 AND 90 ON ROOM AIR AND ORDER FOR O2

## 2011-12-17 NOTE — H&P (Signed)
Katie King is an 60 y.o. female.   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  Past Medical History  Diagnosis Date  . Hypertension   pulsatile tinnitus  Past Surgical History  Procedure Date  . Myringoplasty   . Cerebral embolization     multiple emobolization with onyx  . Dilation and curettage of uterus 1984  . Hernia repair     at age 22  right ear tube placement 2012  History reviewed. No pertinent family history. Social History:  reports that she has never smoked. She does not have any smokeless tobacco history on file. She reports that she drinks about .6 ounces of alcohol per week. She reports that she does not use illicit drugs.Patient is married. Works as Armed forces operational officer. Has one daughter.  Allergies:  Allergies  Allergen Reactions  . Codeine Nausea Only  . Sulfa Antibiotics Nausea Only  . Latex Rash    Contact dermatitis    Medications Prior to Admission  Medication Sig Dispense Refill  . acetaminophen (TYLENOL) 500 MG tablet Take 1,000 mg by mouth daily as needed. For pain        . amLODipine (NORVASC) 10 MG tablet Take 10 mg by mouth daily.        . benazepril (LOTENSIN) 10 MG tablet Take 10 mg by mouth daily.        Marland Kitchen guaiFENesin (MUCINEX) 600 MG 12 hr tablet Take 1,200 mg by mouth daily as needed. For congestion        . hydrochlorothiazide (HYDRODIURIL) 12.5 MG tablet Take 12.5 mg by mouth daily.        Marland Kitchen loratadine (CLARITIN) 10 MG tablet Take 10 mg by mouth daily as needed. For allergies       . Polyethyl Glycol-Propyl Glycol (SYSTANE OP) Place 1 drop into both eyes daily.         Medications Prior to Admission  Medication Dose Route Frequency Provider Last Rate Last Dose  . 0.9 %  sodium chloride infusion   Intravenous  Continuous David Rinehuls, PA      . aspirin EC tablet 325 mg  325 mg Oral Once [employer], PA   325 mg at 12/17/11 0747  . ceFAZolin (ANCEF) IVPB 1 g/50 mL premix  1 g Intravenous 60 min Pre-Op David Rinehuls, PA      . niMODipine (NIMOTOP) capsule 60 mg  60 mg Oral 60 min Pre-Op Delton See, PA   60 mg at 12/17/11 0746    Results for orders placed during the hospital encounter of 12/11/11  APTT      Component Value Range   aPTT 29  24 - 37 (seconds)  BASIC METABOLIC PANEL      Component Value Range   Sodium 140  135 - 145 (mEq/L)   Potassium 3.4 (*) 3.5 - 5.1 (mEq/L)   Chloride 99  96 - 112 (mEq/L)   CO2 31  19 - 32 (mEq/L)   Glucose, Bld 106 (*) 70 - 99 (mg/dL)   BUN 26 (*) 6 - 23 (mg/dL)   Creatinine, Ser 1.30 (*) 0.50 - 1.10 (mg/dL)   Calcium 9.4  8.4 - 86.5 (mg/dL)   GFR calc non Af Amer 43 (*) >90 (mL/min)   GFR calc Af Amer 50 (*) >90 (mL/min)  CBC  Component Value Range   WBC 8.0  4.0 - 10.5 (K/uL)   RBC 5.02  3.87 - 5.11 (MIL/uL)   Hemoglobin 14.3  12.0 - 15.0 (g/dL)   HCT 98.1  19.1 - 47.8 (%)   MCV 84.3  78.0 - 100.0 (fL)   MCH 28.5  26.0 - 34.0 (pg)   MCHC 33.8  30.0 - 36.0 (g/dL)   RDW 29.5  62.1 - 30.8 (%)   Platelets 253  150 - 400 (K/uL)  DIFFERENTIAL      Component Value Range   Neutrophils Relative 46  43 - 77 (%)   Neutro Abs 3.6  1.7 - 7.7 (K/uL)   Lymphocytes Relative 41  12 - 46 (%)   Lymphs Abs 3.3  0.7 - 4.0 (K/uL)   Monocytes Relative 10  3 - 12 (%)   Monocytes Absolute 0.8  0.1 - 1.0 (K/uL)   Eosinophils Relative 3  0 - 5 (%)   Eosinophils Absolute 0.3  0.0 - 0.7 (K/uL)   Basophils Relative 1  0 - 1 (%)   Basophils Absolute 0.0  0.0 - 0.1 (K/uL)  PROTIME-INR      Component Value Range   Prothrombin Time 14.0  11.6 - 15.2 (seconds)   INR 1.06  0.00 - 1.49   SURGICAL PCR SCREEN      Component Value Range   MRSA, PCR NEGATIVE  NEGATIVE    Staphylococcus aureus NEGATIVE  NEGATIVE     Review of Systems  Constitutional: Negative  for fever and chills.  HENT: Positive for tinnitus. Negative for ear pain.        Intermittent pulsatile tinnitus   Eyes: Negative for blurred vision and double vision.  Respiratory: Positive for cough. Negative for shortness of breath.   Cardiovascular: Negative for chest pain.  Gastrointestinal: Negative for nausea, vomiting and abdominal pain.  Neurological: Negative for dizziness, tingling, tremors, sensory change, speech change, focal weakness, seizures, loss of consciousness and headaches.  Endo/Heme/Allergies: Does not bruise/bleed easily.    Blood pressure 147/86, pulse 109, temperature 98.1 F (36.7 C), temperature source Oral, resp. rate 20, SpO2 96.00%. Physical Exam  Constitutional: She is oriented to person, place, and time. She appears well-developed and well-nourished.  HENT:  Head: Normocephalic and atraumatic.  Eyes: EOM are normal. Pupils are equal, round, and reactive to light.  Cardiovascular: Normal rate and regular rhythm.        Slightly tachycardic  Respiratory: Effort normal and breath sounds normal. She has no wheezes. She has no rales.  GI: Soft. Bowel sounds are normal. There is no tenderness.  Musculoskeletal: Normal range of motion.  Neurological: She is alert and oriented to person, place, and time. No cranial nerve deficit. Coordination normal.  Skin: Skin is warm and dry.  Psychiatric: She has a normal mood and affect.     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   Katie King,D KEVIN 12/17/2011, 8:19 AM

## 2011-12-17 NOTE — Transfer of Care (Signed)
Immediate Anesthesia Transfer of Care Note  Patient: Katie King  Procedure(s) Performed: * No procedures listed *  Patient Location: PACU  Anesthesia Type: MAC  Level of Consciousness: awake, alert  and oriented  Airway & Oxygen Therapy: Patient Spontanous Breathing and Patient connected to nasal cannula oxygen  Post-op Assessment: Report given to PACU RN  Post vital signs: Reviewed and stable  Complications: No apparent anesthesia complications

## 2011-12-17 NOTE — ED Notes (Signed)
Family updated as to patient's status.

## 2011-12-17 NOTE — Preoperative (Signed)
Beta Blockers   Reason not to administer Beta Blockers:Not Applicable 

## 2011-12-17 NOTE — ED Notes (Signed)
Angiogram completed, intervention pending

## 2012-04-09 ENCOUNTER — Other Ambulatory Visit: Payer: Self-pay | Admitting: Family Medicine

## 2012-04-09 ENCOUNTER — Other Ambulatory Visit (HOSPITAL_COMMUNITY)
Admission: RE | Admit: 2012-04-09 | Discharge: 2012-04-09 | Disposition: A | Discharge status: Home / Self Care | Payer: 59 | Source: Ambulatory Visit | Attending: Family Medicine | Admitting: Family Medicine

## 2012-04-09 DIAGNOSIS — Z01419 Encounter for gynecological examination (general) (routine) without abnormal findings: Secondary | ICD-10-CM | POA: Insufficient documentation

## 2012-06-21 ENCOUNTER — Other Ambulatory Visit (HOSPITAL_COMMUNITY): Payer: Self-pay | Admitting: Interventional Radiology

## 2012-06-21 DIAGNOSIS — I770 Arteriovenous fistula, acquired: Secondary | ICD-10-CM

## 2012-06-21 DIAGNOSIS — Z09 Encounter for follow-up examination after completed treatment for conditions other than malignant neoplasm: Secondary | ICD-10-CM

## 2012-06-30 ENCOUNTER — Telehealth (HOSPITAL_COMMUNITY): Payer: Self-pay

## 2012-06-30 NOTE — Telephone Encounter (Signed)
Left a message for pt to give me a call regarding her change of apt time to 9am / 730 arrival  I also need her updated ins

## 2012-07-06 ENCOUNTER — Other Ambulatory Visit: Payer: Self-pay | Admitting: Radiology

## 2012-07-13 ENCOUNTER — Ambulatory Visit (HOSPITAL_COMMUNITY): Payer: 59

## 2012-07-14 ENCOUNTER — Encounter (HOSPITAL_COMMUNITY): Payer: Self-pay | Admitting: Pharmacy Technician

## 2012-07-15 ENCOUNTER — Encounter (HOSPITAL_COMMUNITY): Payer: Self-pay

## 2012-07-15 ENCOUNTER — Ambulatory Visit (HOSPITAL_COMMUNITY)
Admission: RE | Admit: 2012-07-15 | Discharge: 2012-07-15 | Disposition: A | Discharge status: Home / Self Care | Payer: No Typology Code available for payment source | Source: Ambulatory Visit | Attending: Interventional Radiology | Admitting: Interventional Radiology

## 2012-07-15 ENCOUNTER — Other Ambulatory Visit (HOSPITAL_COMMUNITY): Payer: Self-pay | Admitting: Interventional Radiology

## 2012-07-15 DIAGNOSIS — I770 Arteriovenous fistula, acquired: Secondary | ICD-10-CM

## 2012-07-15 DIAGNOSIS — Z09 Encounter for follow-up examination after completed treatment for conditions other than malignant neoplasm: Secondary | ICD-10-CM | POA: Insufficient documentation

## 2012-07-15 DIAGNOSIS — I671 Cerebral aneurysm, nonruptured: Secondary | ICD-10-CM | POA: Insufficient documentation

## 2012-07-15 DIAGNOSIS — Z9889 Other specified postprocedural states: Secondary | ICD-10-CM | POA: Insufficient documentation

## 2012-07-15 DIAGNOSIS — I10 Essential (primary) hypertension: Secondary | ICD-10-CM | POA: Insufficient documentation

## 2012-07-15 DIAGNOSIS — R51 Headache: Secondary | ICD-10-CM | POA: Insufficient documentation

## 2012-07-15 LAB — BASIC METABOLIC PANEL
CO2: 26 mEq/L (ref 19–32)
Chloride: 102 mEq/L (ref 96–112)
Potassium: 3.7 mEq/L (ref 3.5–5.1)
Sodium: 140 mEq/L (ref 135–145)

## 2012-07-15 LAB — CBC
HCT: 44.1 % (ref 36.0–46.0)
Hemoglobin: 14.7 g/dL (ref 12.0–15.0)
MCV: 84.3 fL (ref 78.0–100.0)
Platelets: 236 10*3/uL (ref 150–400)
RBC: 5.23 MIL/uL — ABNORMAL HIGH (ref 3.87–5.11)
WBC: 9.3 10*3/uL (ref 4.0–10.5)

## 2012-07-15 LAB — PROTIME-INR: Prothrombin Time: 13.2 seconds (ref 11.6–15.2)

## 2012-07-15 LAB — DIFFERENTIAL
Eosinophils Relative: 4 % (ref 0–5)
Lymphocytes Relative: 23 % (ref 12–46)
Lymphs Abs: 2.2 10*3/uL (ref 0.7–4.0)
Monocytes Absolute: 0.4 10*3/uL (ref 0.1–1.0)

## 2012-07-15 LAB — APTT: aPTT: 31 seconds (ref 24–37)

## 2012-07-15 MED ORDER — MIDAZOLAM HCL 5 MG/5ML IJ SOLN
INTRAMUSCULAR | Status: AC | PRN
  Administered 2012-07-15: 1 mg via INTRAVENOUS

## 2012-07-15 MED ORDER — MIDAZOLAM HCL 2 MG/2ML IJ SOLN
INTRAMUSCULAR | Status: AC
  Filled 2012-07-15: qty 2

## 2012-07-15 MED ORDER — SODIUM CHLORIDE 0.9 % IV SOLN: INTRAVENOUS | Status: AC

## 2012-07-15 MED ORDER — SODIUM CHLORIDE 0.9 % IR SOLN
Status: AC | PRN
  Administered 2012-07-15 (×2)

## 2012-07-15 MED ORDER — FENTANYL CITRATE 0.05 MG/ML IJ SOLN
INTRAMUSCULAR | Status: AC
  Filled 2012-07-15: qty 2

## 2012-07-15 MED ORDER — SODIUM CHLORIDE 0.9 % IV SOLN
Freq: Once | INTRAVENOUS | Status: AC
  Administered 2012-07-15: 1000 mL via INTRAVENOUS

## 2012-07-15 MED ORDER — FENTANYL CITRATE 0.05 MG/ML IJ SOLN
INTRAMUSCULAR | Status: AC | PRN
  Administered 2012-07-15: 25 ug via INTRAVENOUS

## 2012-07-15 MED ORDER — IOHEXOL 300 MG/ML  SOLN
200.0000 mL | Freq: Once | INTRAMUSCULAR | Status: AC | PRN
  Administered 2012-07-15: 80 mL via INTRA_ARTERIAL

## 2012-07-15 NOTE — Progress Notes (Signed)
1400 ambulatory to BR without incident.  Voided qs.  Right groin with out sign of bleeding or hematoma.

## 2012-07-15 NOTE — Procedures (Signed)
S/P $ vessel cerebral arteriogram. RT CFA  Approach. Preliminary findings  1.persistent post fossaa DAVF fed by Lt PICA ,lt superior cerebellar artery ,and minimally by the Lt occipital artery

## 2012-07-15 NOTE — H&P (Signed)
Chief Complaint: "I'm here for another angiogram" Referring Physician:Deveshwar HPI: Katie King is an 61 y.o. female with hx of dural AV fistula with prior embolization. Last angiogram was 12/12. She is scheduled today for follow up angiogram. She states she has been doing very well. No recurrent sxs or neurologic events. Denies recent illness or fevers.  Past Medical History:  Past Medical History  Diagnosis Date  . Hypertension     Past Surgical History:  Past Surgical History  Procedure Date  . Myringoplasty   . Cerebral embolization     multiple emobolization with onyx  . Dilation and curettage of uterus 1984  . Hernia repair     at age 8    Family History: No family history on file.  Social History:  reports that she has never smoked. She does not have any smokeless tobacco history on file. She reports that she drinks about .6 ounces of alcohol per week. She reports that she does not use illicit drugs.  Allergies:  Allergies  Allergen Reactions  . Codeine Nausea Only  . Sulfa Antibiotics Nausea Only  . Latex Rash    Contact dermatitis    Medications: acetaminophen (TYLENOL) 500 MG tablet (Taking) Sig - Route: Take 1,000 mg by mouth daily as needed. For headach and pain - Oral Class: Historical Med Number of times this order has been changed since signing: 2 Order [address] amLODipine (NORVASC) 10 MG tablet (Taking) Sig - Route: Take 10 mg by mouth daily. - Oral Class: Historical Med Number of times this order has been changed since signing: 1 Order [address] hydrochlorothiazide (HYDRODIURIL) 12.5 MG tablet (Taking) Sig - Route: Take 12.5 mg by mouth daily. - Oral Class: Historical Med Number of times this order has been changed since signing: 1 Order [address] loratadine (CLARITIN) 10 MG tablet (Taking) Sig - Route: Take 10 mg by mouth daily as needed. For allergies - Oral Class: Historical Med Number of times this order has been changed since signing: 1 Order [facility] Polyethyl Glycol-Propyl Glycol (SYSTANE OP) (Taking) Sig - Route: Place 1 drop into both eyes daily.    Please HPI for pertinent positives, otherwise complete 10 system ROS negative.  Physical Exam: Blood pressure 124/74, pulse 98, temperature 97.2 F (36.2 C), temperature source Oral, resp. rate 18, height 5\' 5"  (1.651 m), weight 240 lb (108.863 kg), SpO2 94.00%. Body mass index is 39.94 kg/(m^2).   General Appearance:  Alert, cooperative, no distress, appears stated age, obese  Head:  Normocephalic, without obvious abnormality, atraumatic  ENT: Unremarkable  Neck: Supple, symmetrical, trachea midline, no adenopathy, thyroid: not enlarged, symmetric, no tenderness/mass/nodules  Lungs:   Clear to auscultation bilaterally, no w/r/r, respirations unlabored without use of accessory muscles.  Heart:  Regular rate and rhythm, S1, S2 normal, no murmur, rub or gallop. Carotids 2+ without bruit.  Abdomen:   Soft, non-tender, non distended. Bowel sounds active all four quadrants,  no masses, no organomegaly.  Extremities: Extremities normal, atraumatic, no cyanosis or edema  Pulses: 2+ and symmetric femoral  Neurologic: Normal affect, no gross deficits.   Results for orders placed during the hospital encounter of 07/15/12 (from the past 48 hour(s))  APTT     Status: Normal   Collection Time   07/15/12  7:47 AM      Component Value Range Comment   aPTT 31  24 - 37 seconds   CBC     Status: Abnormal   Collection Time  07/15/12  7:47 AM      Component Value Range Comment   WBC 9.3  4.0 - 10.5 K/uL    RBC 5.23 (*) 3.87 - 5.11 MIL/uL    Hemoglobin 14.7  12.0 - 15.0 g/dL    HCT 16.1  09.6 - 04.5 %    MCV 84.3  78.0 - 100.0 fL    MCH 28.1  26.0 - 34.0 pg    MCHC 33.3  30.0 - 36.0 g/dL    RDW 40.9  81.1 - 91.4 %    Platelets 236  150 - 400 K/uL   DIFFERENTIAL     Status: Normal   Collection Time   07/15/12  7:47 AM      Component Value Range Comment   Neutrophils Relative 68  43 - 77 %     Neutro Abs 6.3  1.7 - 7.7 K/uL    Lymphocytes Relative 23  12 - 46 %    Lymphs Abs 2.2  0.7 - 4.0 K/uL    Monocytes Relative 4  3 - 12 %    Monocytes Absolute 0.4  0.1 - 1.0 K/uL    Eosinophils Relative 4  0 - 5 %    Eosinophils Absolute 0.4  0.0 - 0.7 K/uL    Basophils Relative 0  0 - 1 %    Basophils Absolute 0.0  0.0 - 0.1 K/uL   PROTIME-INR     Status: Normal   Collection Time   07/15/12  7:47 AM      Component Value Range Comment   Prothrombin Time 13.2  11.6 - 15.2 seconds    INR 0.98  0.00 - 1.49    No results found.  Assessment/Plan Hx dural fistula with prior embolization. For follow up cerebral angio today. Reviewed procedure including risks and complications. Labs ok except BMET pending. Consent signed in chart.  Brayton El PA-C 07/15/2012, 8:35 AM

## 2012-07-19 ENCOUNTER — Telehealth (HOSPITAL_COMMUNITY): Payer: Self-pay

## 2012-07-19 NOTE — Telephone Encounter (Signed)
Mrs. Katie King called the office today in regards to future treatment.  Dr. Cathie Beams advised her that we could treat in 1 to 2 months.  Pt wants to move further with this.  She will call me back tomorrow to schedule

## 2012-07-21 ENCOUNTER — Other Ambulatory Visit (HOSPITAL_COMMUNITY): Payer: Self-pay | Admitting: Interventional Radiology

## 2012-07-21 DIAGNOSIS — I2541 Coronary artery aneurysm: Secondary | ICD-10-CM

## 2012-08-05 ENCOUNTER — Encounter (HOSPITAL_COMMUNITY): Payer: Self-pay | Admitting: Pharmacy Technician

## 2012-08-10 ENCOUNTER — Other Ambulatory Visit: Payer: Self-pay | Admitting: Radiology

## 2012-08-10 NOTE — Pre-Procedure Instructions (Signed)
20 Briane A Wickizer  08/10/2012   Your procedure is scheduled on:  08-18-2012  Report to [facility] at 6:30 AM.  Call this number if you have problems the morning of surgery: [phone]   Remember:   Do not eat food or drink:After Midnight.      Take these medicines the morning of surgery with A SIP OF WATER: amlodipine(Norvasc),tylenol as needed   Do not wear jewelry, make-up or nail polish.  Do not wear lotions, powders, or perfumes. You may wear deodorant.  Do not shave 48 hours prior to surgery. Men may shave face and neck.  Do not bring valuables to the hospital.  Contacts, dentures or bridgework may not be worn into surgery.  Leave suitcase in the car. After surgery it may be brought to your room.  For patients admitted to the hospital, checkout time is 11:00 AM the day of discharge.    Special Instructions: CHG Shower Use Special Wash: 1/2 bottle night before surgery and 1/2 bottle morning of surgery.   Please read over the following fact sheets that you were given: Pain Booklet, MRSA Information and Surgical Site Infection Prevention

## 2012-08-11 ENCOUNTER — Encounter (HOSPITAL_COMMUNITY)
Admission: RE | Admit: 2012-08-11 | Discharge: 2012-08-11 | Disposition: A | Discharge status: Home / Self Care | Payer: No Typology Code available for payment source | Source: Ambulatory Visit | Attending: Interventional Radiology | Admitting: Interventional Radiology

## 2012-08-11 ENCOUNTER — Other Ambulatory Visit (HOSPITAL_COMMUNITY): Payer: 59

## 2012-08-11 ENCOUNTER — Encounter (HOSPITAL_COMMUNITY): Payer: Self-pay

## 2012-08-11 LAB — CBC WITH DIFFERENTIAL/PLATELET
Basophils Absolute: 0.1 10*3/uL (ref 0.0–0.1)
Basophils Relative: 1 % (ref 0–1)
Eosinophils Relative: 6 % — ABNORMAL HIGH (ref 0–5)
HCT: 41.1 % (ref 36.0–46.0)
MCHC: 33.8 g/dL (ref 30.0–36.0)
MCV: 83.7 fL (ref 78.0–100.0)
Monocytes Absolute: 0.4 10*3/uL (ref 0.1–1.0)
Neutro Abs: 5.2 10*3/uL (ref 1.7–7.7)
RDW: 13.7 % (ref 11.5–15.5)

## 2012-08-11 LAB — COMPREHENSIVE METABOLIC PANEL
AST: 27 U/L (ref 0–37)
Albumin: 3.7 g/dL (ref 3.5–5.2)
Calcium: 9.5 mg/dL (ref 8.4–10.5)
Creatinine, Ser: 0.75 mg/dL (ref 0.50–1.10)
Total Protein: 7.1 g/dL (ref 6.0–8.3)

## 2012-08-11 LAB — APTT: aPTT: 27 seconds (ref 24–37)

## 2012-08-11 LAB — SURGICAL PCR SCREEN
MRSA, PCR: NEGATIVE
Staphylococcus aureus: NEGATIVE

## 2012-08-11 LAB — PROTIME-INR: INR: 1.03 (ref 0.00–1.49)

## 2012-08-13 ENCOUNTER — Other Ambulatory Visit (HOSPITAL_COMMUNITY): Payer: 59

## 2012-08-16 ENCOUNTER — Telehealth (HOSPITAL_COMMUNITY): Payer: Self-pay

## 2012-08-16 NOTE — Telephone Encounter (Signed)
I left a message for Katie King to give me a call in regards to rescheduling the procedure set for Weds

## 2012-08-17 ENCOUNTER — Telehealth (HOSPITAL_COMMUNITY): Payer: Self-pay

## 2012-08-17 NOTE — Telephone Encounter (Signed)
I spoke with Katie King in regards to rescheduling her procedure.  Although she was not happy she agreed to reschedule for the following week.  She will call the office back this afternoon for a true confirmation.

## 2012-08-18 ENCOUNTER — Encounter (HOSPITAL_COMMUNITY): Admission: RE | Payer: Self-pay | Source: Ambulatory Visit

## 2012-08-18 ENCOUNTER — Ambulatory Visit (HOSPITAL_COMMUNITY)
Admission: RE | Admit: 2012-08-18 | Payer: No Typology Code available for payment source | Source: Ambulatory Visit | Admitting: Interventional Radiology

## 2012-08-18 ENCOUNTER — Ambulatory Visit (HOSPITAL_COMMUNITY)
Admission: RE | Admit: 2012-08-18 | Discharge: 2012-08-18 | Payer: No Typology Code available for payment source | Source: Ambulatory Visit | Attending: Interventional Radiology | Admitting: Interventional Radiology

## 2012-08-18 SURGERY — RADIOLOGY WITH ANESTHESIA
Anesthesia: General

## 2012-08-25 ENCOUNTER — Encounter (HOSPITAL_COMMUNITY): Payer: Self-pay | Admitting: *Deleted

## 2012-08-25 ENCOUNTER — Ambulatory Visit (HOSPITAL_COMMUNITY)
Admission: RE | Admit: 2012-08-25 | Discharge: 2012-08-25 | Disposition: A | Discharge status: Home / Self Care | Payer: No Typology Code available for payment source | Source: Ambulatory Visit | Attending: Interventional Radiology | Admitting: Interventional Radiology

## 2012-08-25 ENCOUNTER — Encounter (HOSPITAL_COMMUNITY): Payer: Self-pay | Admitting: Anesthesiology

## 2012-08-25 ENCOUNTER — Ambulatory Visit (HOSPITAL_COMMUNITY): Payer: No Typology Code available for payment source | Admitting: Anesthesiology

## 2012-08-25 ENCOUNTER — Encounter (HOSPITAL_COMMUNITY)
Admission: RE | Disposition: A | Discharge status: Home / Self Care | Payer: Self-pay | Source: Ambulatory Visit | Attending: Interventional Radiology

## 2012-08-25 ENCOUNTER — Observation Stay (HOSPITAL_COMMUNITY)
Admission: RE | Admit: 2012-08-25 | Discharge: 2012-08-26 | DRG: 026 | Disposition: A | Discharge status: Home / Self Care | Payer: No Typology Code available for payment source | Source: Ambulatory Visit | Attending: Interventional Radiology | Admitting: Interventional Radiology

## 2012-08-25 ENCOUNTER — Encounter (HOSPITAL_COMMUNITY): Payer: Self-pay

## 2012-08-25 DIAGNOSIS — I2541 Coronary artery aneurysm: Secondary | ICD-10-CM

## 2012-08-25 DIAGNOSIS — I671 Cerebral aneurysm, nonruptured: Principal | ICD-10-CM | POA: Insufficient documentation

## 2012-08-25 DIAGNOSIS — IMO0001 Reserved for inherently not codable concepts without codable children: Secondary | ICD-10-CM

## 2012-08-25 DIAGNOSIS — I770 Arteriovenous fistula, acquired: Secondary | ICD-10-CM | POA: Insufficient documentation

## 2012-08-25 LAB — CBC WITH DIFFERENTIAL/PLATELET
Eosinophils Relative: 3 % (ref 0–5)
HCT: 36.4 % (ref 36.0–46.0)
Hemoglobin: 12.3 g/dL (ref 12.0–15.0)
Lymphocytes Relative: 19 % (ref 12–46)
MCHC: 33.8 g/dL (ref 30.0–36.0)
MCV: 83.7 fL (ref 78.0–100.0)
Monocytes Absolute: 0.3 10*3/uL (ref 0.1–1.0)
Monocytes Relative: 4 % (ref 3–12)
Neutro Abs: 4.9 10*3/uL (ref 1.7–7.7)
WBC: 6.8 10*3/uL (ref 4.0–10.5)

## 2012-08-25 LAB — POCT ACTIVATED CLOTTING TIME: Activated Clotting Time: 169 seconds

## 2012-08-25 SURGERY — RADIOLOGY WITH ANESTHESIA
Anesthesia: General

## 2012-08-25 MED ORDER — NIMODIPINE 30 MG PO CAPS
ORAL_CAPSULE | ORAL | Status: AC
  Administered 2012-08-25: 60 mg
  Filled 2012-08-25: qty 2

## 2012-08-25 MED ORDER — NICARDIPINE HCL IN NACL 20-0.86 MG/200ML-% IV SOLN
5.0000 mg/h | INTRAVENOUS | Status: DC
  Administered 2012-08-25: 5 mg/h via INTRAVENOUS
  Administered 2012-08-25: 1 mg/h via INTRAVENOUS
  Filled 2012-08-25 (×3): qty 200

## 2012-08-25 MED ORDER — PROPOFOL 10 MG/ML IV EMUL
INTRAVENOUS | Status: DC | PRN
  Administered 2012-08-25: 200 mg via INTRAVENOUS

## 2012-08-25 MED ORDER — CYCLOSPORINE 0.05 % OP EMUL
1.0000 [drp] | Freq: Two times a day (BID) | OPHTHALMIC | Status: DC
  Administered 2012-08-25 – 2012-08-26 (×2): 1 [drp] via OPHTHALMIC
  Filled 2012-08-25 (×3): qty 1

## 2012-08-25 MED ORDER — FENTANYL CITRATE 0.05 MG/ML IJ SOLN: 25.0000 ug | INTRAMUSCULAR | Status: DC | PRN

## 2012-08-25 MED ORDER — FENTANYL CITRATE 0.05 MG/ML IJ SOLN: 50.0000 ug | INTRAMUSCULAR | Status: DC | PRN

## 2012-08-25 MED ORDER — SODIUM CHLORIDE 0.9 % IJ SOLN
1.5000 mg | INTRAVENOUS | Status: DC
  Filled 2012-08-25: qty 0.3

## 2012-08-25 MED ORDER — HEPARIN SODIUM (PORCINE) 1000 UNIT/ML IJ SOLN
INTRAMUSCULAR | Status: DC | PRN
  Administered 2012-08-25: 500 [IU] via INTRAVENOUS
  Administered 2012-08-25 (×2): 1000 [IU] via INTRAVENOUS
  Administered 2012-08-25: 500 [IU] via INTRAVENOUS
  Administered 2012-08-25: 1000 [IU] via INTRAVENOUS

## 2012-08-25 MED ORDER — ONDANSETRON HCL 4 MG/2ML IJ SOLN: 4.0000 mg | Freq: Four times a day (QID) | INTRAMUSCULAR | Status: DC | PRN

## 2012-08-25 MED ORDER — AMLODIPINE BESYLATE 10 MG PO TABS
10.0000 mg | ORAL_TABLET | Freq: Every morning | ORAL | Status: DC
  Administered 2012-08-26: 10 mg via ORAL
  Filled 2012-08-25: qty 1

## 2012-08-25 MED ORDER — MIDAZOLAM HCL 5 MG/5ML IJ SOLN
INTRAMUSCULAR | Status: DC | PRN
  Administered 2012-08-25 (×2): 1 mg via INTRAVENOUS

## 2012-08-25 MED ORDER — PHENYLEPHRINE HCL 10 MG/ML IJ SOLN
10.0000 mg | INTRAVENOUS | Status: DC | PRN
  Administered 2012-08-25: 30 ug/min via INTRAVENOUS

## 2012-08-25 MED ORDER — BENAZEPRIL HCL 20 MG PO TABS
20.0000 mg | ORAL_TABLET | Freq: Every morning | ORAL | Status: DC
  Administered 2012-08-26: 20 mg via ORAL
  Filled 2012-08-25: qty 1

## 2012-08-25 MED ORDER — HYDROCHLOROTHIAZIDE 12.5 MG PO CAPS
12.5000 mg | ORAL_CAPSULE | Freq: Every day | ORAL | Status: DC
  Administered 2012-08-26: 12.5 mg via ORAL
  Filled 2012-08-25: qty 1

## 2012-08-25 MED ORDER — MIDAZOLAM HCL 2 MG/2ML IJ SOLN: 1.0000 mg | INTRAMUSCULAR | Status: DC | PRN

## 2012-08-25 MED ORDER — PROMETHAZINE HCL 25 MG/ML IJ SOLN: 6.2500 mg | INTRAMUSCULAR | Status: DC | PRN

## 2012-08-25 MED ORDER — DEXAMETHASONE SODIUM PHOSPHATE 4 MG/ML IJ SOLN
INTRAMUSCULAR | Status: DC | PRN
  Administered 2012-08-25: 10 mg via INTRAVENOUS

## 2012-08-25 MED ORDER — ROCURONIUM BROMIDE 100 MG/10ML IV SOLN
INTRAVENOUS | Status: DC | PRN
  Administered 2012-08-25: 10 mg via INTRAVENOUS
  Administered 2012-08-25: 20 mg via INTRAVENOUS
  Administered 2012-08-25: 10 mg via INTRAVENOUS
  Administered 2012-08-25: 50 mg via INTRAVENOUS

## 2012-08-25 MED ORDER — ASPIRIN EC 325 MG PO TBEC
DELAYED_RELEASE_TABLET | ORAL | Status: AC
  Administered 2012-08-25: 325 mg
  Filled 2012-08-25: qty 1

## 2012-08-25 MED ORDER — SUCCINYLCHOLINE CHLORIDE 20 MG/ML IJ SOLN
INTRAMUSCULAR | Status: DC | PRN
  Administered 2012-08-25: 100 mg via INTRAVENOUS

## 2012-08-25 MED ORDER — ACETAMINOPHEN 650 MG RE SUPP: 650.0000 mg | Freq: Four times a day (QID) | RECTAL | Status: DC | PRN

## 2012-08-25 MED ORDER — FENTANYL CITRATE 0.05 MG/ML IJ SOLN
INTRAMUSCULAR | Status: DC | PRN
  Administered 2012-08-25: 100 ug via INTRAVENOUS
  Administered 2012-08-25: 150 ug via INTRAVENOUS

## 2012-08-25 MED ORDER — IOHEXOL 300 MG/ML  SOLN
300.0000 mL | Freq: Once | INTRAMUSCULAR | Status: AC | PRN
  Administered 2012-08-25: 140 mL via INTRA_ARTERIAL

## 2012-08-25 MED ORDER — SODIUM CHLORIDE 0.9 % IV SOLN
INTRAVENOUS | Status: DC | PRN
  Administered 2012-08-25 (×2): via INTRAVENOUS

## 2012-08-25 MED ORDER — LIDOCAINE HCL (CARDIAC) 20 MG/ML IV SOLN
INTRAVENOUS | Status: DC | PRN
  Administered 2012-08-25: 100 mg via INTRAVENOUS

## 2012-08-25 MED ORDER — ONDANSETRON HCL 4 MG/2ML IJ SOLN
INTRAMUSCULAR | Status: DC | PRN
  Administered 2012-08-25: 4 mg via INTRAVENOUS

## 2012-08-25 MED ORDER — ACETAMINOPHEN 500 MG PO TABS
1000.0000 mg | ORAL_TABLET | Freq: Four times a day (QID) | ORAL | Status: DC | PRN
  Administered 2012-08-25: 1000 mg via ORAL
  Filled 2012-08-25: qty 2

## 2012-08-25 MED ORDER — SODIUM CHLORIDE 0.9 % IV SOLN
INTRAVENOUS | Status: DC | PRN
  Administered 2012-08-25: 09:00:00 via INTRAVENOUS

## 2012-08-25 MED ORDER — SODIUM CHLORIDE 0.9 % IV SOLN
INTRAVENOUS | Status: AC
  Administered 2012-08-25: 16:00:00 via INTRAVENOUS

## 2012-08-25 MED ORDER — CEFAZOLIN SODIUM-DEXTROSE 2-3 GM-% IV SOLR
INTRAVENOUS | Status: DC | PRN
  Administered 2012-08-25: 2 g via INTRAVENOUS

## 2012-08-25 NOTE — Progress Notes (Signed)
Day of Surgery  Subjective: Doing well. Tolerating ice chips. Denies nausea, vomiting, headache or visual changes. Little groggy after anesthesia initially; but feeling better.   Objective: Vital signs in last 24 hours: Temp:  [97.8 F (36.6 C)-98 F (36.7 C)] 97.8 F (36.6 C) (08/28 1414) Pulse Rate:  [82-92] 86  (08/28 1530) Resp:  [13-31] 13  (08/28 1530) BP: (106-124)/(52-82) 106/52 mmHg (08/28 1500) SpO2:  [93 %-98 %] 93 % (08/28 1530) Arterial Line BP: (106-121)/(55-61) 106/55 mmHg (08/28 1530) Weight:  [257 lb 15 oz (117 kg)] 257 lb 15 oz (117 kg) (08/28 1414)    Intake/Output this shift: Total I/O In: 1280.4 [I.V.:1280.4] Out: 350 [Urine:350]  PE:  Alert and oriented x 3.  PERRL. EOMI. Tongue midline with clear speech. 5/5 strength intact of all 4 extremities. Groin site clean and dry with pressure bandage in place. 2+ femoral and DP pulse with warm leg.  Finger nose coordination intact.   Lab Results:   Basename 08/25/12 1303  WBC 6.8  HGB 12.3  HCT 36.4  PLT 193   Assessment/Plan: Dural AVF embolization under general anesthesia. Patient stable neurologically and hemodynamically with BP control on cardene at this time. Will advance diet to full liquids over night then to regular diet in am as tolerated.  If remains stable overnight - anticipate discharge in am.    LOS: 0 days    Ayaka Andes D 08/25/2012

## 2012-08-25 NOTE — Procedures (Signed)
S/P 3 vessel arteriogram followed by embolization of Lt PICA for prominent DAVF using ONYX 18

## 2012-08-25 NOTE — H&P (Signed)
Katie King is an 61 y.o. female.   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 Has had previous embolizations with Dr Corliss Skains 01/15/2011 and 04/23/2011 Angio 07/20/2012 shows residual AV fistula flow. Pt remains asymptomatic Now scheduled for additional embolization HPI: HTN  Past Medical History  Diagnosis Date  . Hypertension     Past Surgical History  Procedure Date  . Myringoplasty   . Cerebral embolization     multiple emobolization with onyx  . Dilation and curettage of uterus 1984  . Hernia repair     at age 68  . Dural av fistula     embolization    No family history on file. Social History:  reports that she has never smoked. She does not have any smokeless tobacco history on file. She reports that she drinks about .6 ounces of alcohol per week. She reports that she does not use illicit drugs.  Allergies:  Allergies  Allergen Reactions  . Codeine Nausea Only  . Sulfa Antibiotics Nausea Only  . Latex Rash    Contact dermatitis     (Not in a hospital admission)  No results found for this or any previous visit (from the past 48 hour(s)). No results found.  Review of Systems  Constitutional: Negative for fever.  HENT: Positive for hearing loss.        Rt ear  Respiratory: Negative for shortness of breath.   Cardiovascular: Negative for chest pain.  Gastrointestinal: Negative for nausea, vomiting and abdominal pain.  Neurological: Negative for dizziness, tingling, speech change, focal weakness, seizures and weakness.    There were no vitals taken for this visit. Physical Exam  Constitutional: She is oriented to person, place, and time. She appears well-developed and well-nourished.  HENT:       Rt ear hearing loss  Neck: Normal range of motion.  Cardiovascular: Normal rate, regular rhythm and normal heart sounds.   No murmur heard. Respiratory: Effort normal and  breath sounds normal. She has no wheezes.  GI: Soft. Bowel sounds are normal. There is no tenderness.  Musculoskeletal: Normal range of motion.  Neurological: She is alert and oriented to person, place, and time.  Skin: Skin is warm and dry.  Psychiatric: She has a normal mood and affect. Her behavior is normal. Judgment and thought content normal.     Assessment/Plan Previous embolizations of Dural AV fistula 12/2010 and 03/2011 Re angio on 06/2012 shows residual flow; asymptomatic Now scheduled for additional embolization  Pt aware of procedure benefits and risks and agreeable to proceed Consent in chart Pt aware she may be admitted to Neuro ICU overnight for observation  Katie King A 08/25/2012, 7:21 AM

## 2012-08-25 NOTE — Anesthesia Postprocedure Evaluation (Signed)
  Anesthesia Post-op Note  Patient: Katie King  Procedure(s) Performed: Procedure(s) (LRB): RADIOLOGY WITH ANESTHESIA (N/A)  Patient Location: PACU  Anesthesia Type: General  Level of Consciousness: awake  Airway and Oxygen Therapy: Patient Spontanous Breathing  Post-op Pain: mild  Post-op Assessment: Post-op Vital signs reviewed, Patient's Cardiovascular Status Stable, Respiratory Function Stable, Patent Airway, No signs of Nausea or vomiting and Pain level controlled  Post-op Vital Signs: stable  Complications: No apparent anesthesia complications

## 2012-08-25 NOTE — Anesthesia Preprocedure Evaluation (Signed)
Anesthesia Evaluation  Patient identified by MRN, date of birth, ID band Patient awake    Reviewed: Allergy & Precautions, H&P , NPO status , Patient's Chart, lab work & pertinent test results  Airway Mallampati: II TM Distance: <3 FB Neck ROM: Full    Dental  (+) Teeth Intact and Dental Advisory Given   Pulmonary neg pulmonary ROS,          Cardiovascular hypertension, Pt. on medications     Neuro/Psych negative neurological ROS     GI/Hepatic negative GI ROS, Neg liver ROS,   Endo/Other  negative endocrine ROSMorbid obesity  Renal/GU negative Renal ROS     Musculoskeletal negative musculoskeletal ROS (+)   Abdominal (+) + obese,   Peds  Hematology negative hematology ROS (+)   Anesthesia Other Findings   Reproductive/Obstetrics                           Anesthesia Physical Anesthesia Plan  ASA: III  Anesthesia Plan: General   Post-op Pain Management:    Induction: Intravenous  Airway Management Planned: Oral ETT  Additional Equipment:   Intra-op Plan:   Post-operative Plan: Extubation in OR  Informed Consent: I have reviewed the patients History and Physical, chart, labs and discussed the procedure including the risks, benefits and alternatives for the proposed anesthesia with the patient or authorized representative who has indicated his/her understanding and acceptance.     Plan Discussed with: CRNA and Surgeon  Anesthesia Plan Comments:         Anesthesia Quick Evaluation

## 2012-08-25 NOTE — Transfer of Care (Signed)
Immediate Anesthesia Transfer of Care Note  Patient: Katie King  Procedure(s) Performed: Procedure(s) (LRB): RADIOLOGY WITH ANESTHESIA (N/A)  Patient Location: PACU  Anesthesia Type: General  Level of Consciousness: awake, oriented, sedated and patient cooperative  Airway & Oxygen Therapy: Patient Spontanous Breathing and Patient connected to nasal cannula oxygen  Post-op Assessment: Report given to PACU RN and Post -op Vital signs reviewed and stable  Post vital signs: Reviewed and stable  Complications: No apparent anesthesia complications

## 2012-08-26 LAB — BASIC METABOLIC PANEL
CO2: 25 mEq/L (ref 19–32)
Calcium: 8.7 mg/dL (ref 8.4–10.5)
Creatinine, Ser: 0.65 mg/dL (ref 0.50–1.10)
GFR calc non Af Amer: >90 mL/min (ref >90)
Glucose, Bld: 153 mg/dL — ABNORMAL HIGH (ref 70–99)

## 2012-08-26 LAB — CBC WITH DIFFERENTIAL/PLATELET
Basophils Absolute: 0 10*3/uL (ref 0.0–0.1)
Eosinophils Absolute: 0 10*3/uL (ref 0.0–0.7)
Eosinophils Relative: 0 % (ref 0–5)
HCT: 36.3 % (ref 36.0–46.0)
Lymphocytes Relative: 8 % — ABNORMAL LOW (ref 12–46)
Lymphs Abs: 0.9 10*3/uL (ref 0.7–4.0)
MCH: 27.9 pg (ref 26.0–34.0)
MCV: 83.6 fL (ref 78.0–100.0)
Monocytes Absolute: 0.3 10*3/uL (ref 0.1–1.0)
Platelets: 207 10*3/uL (ref 150–400)
RDW: 13.7 % (ref 11.5–15.5)
WBC: 11.2 10*3/uL — ABNORMAL HIGH (ref 4.0–10.5)

## 2012-08-26 NOTE — Discharge Summary (Signed)
Physician Discharge Summary  Patient ID: Katie King MRN: [id number] DOB/AGE: 06-01-1951 61 y.o.  Admit date: 08/25/2012 Discharge date: 08/26/2012  Admission Diagnoses: Dural Arterio-Venous fistula Discharge Diagnoses: Dural AV fistula embolization   Active Problems:  * No active hospital problems. *    Discharged Condition: stable; improved  Hospital Course: Established pt of Dr Corliss Skains. Previous AV fistula treatment 12/2010; 03/2011. Follow up periodically. Arteriogram 07/20/12 revealed flow into fistula. Cerebral arteriogram with additional embolization performed 08/25/12.  Pt tolerated well. No complaints. No problems overnight stay in Neuro ICU.  Eating well; slept ok; urinating; passing gas. Denies headache; visual or hearing abnormalities. Neuro intact.  Dr Corliss Skains has seen and examined pt. Will dc to home now. Follow up 09/08/12 at 1pm.  Consults: None  Significant Diagnostic Studies: Cerebral arteriogram  Treatments: Dural AV fistula embolization  Discharge Exam: Blood pressure 108/57, pulse 92, temperature 98 F (36.7 C), temperature source Oral, resp. rate 15, height 5\' 5"  (1.651 m), weight 257 lb 15 oz (117 kg), SpO2 95.00%.  PE:  A/O; appropriate VSS; afeb Face symmetrical; smile = Follows all commands Good finger to nose coordination Good strength; moves all 4s Ambulating in hall Heart: RRR Lungs: CTA Abd: soft; +BS; NT Extr: all moving well Rt groin NT; no bleeding; no hematoma Results for orders placed during the hospital encounter of 08/25/12  CBC WITH DIFFERENTIAL      Component Value Range   WBC 6.8  4.0 - 10.5 K/uL   RBC 4.35  3.87 - 5.11 MIL/uL   Hemoglobin 12.3  12.0 - 15.0 g/dL   HCT 13.0  86.5 - 78.4 %   MCV 83.7  78.0 - 100.0 fL   MCH 28.3  26.0 - 34.0 pg   MCHC 33.8  30.0 - 36.0 g/dL   RDW 69.6  29.5 - 28.4 %   Platelets 193  150 - 400 K/uL   Neutrophils Relative 73  43 - 77 %   Neutro Abs 4.9  1.7 - 7.7 K/uL   Lymphocytes Relative  19  12 - 46 %   Lymphs Abs 1.3  0.7 - 4.0 K/uL   Monocytes Relative 4  3 - 12 %   Monocytes Absolute 0.3  0.1 - 1.0 K/uL   Eosinophils Relative 3  0 - 5 %   Eosinophils Absolute 0.2  0.0 - 0.7 K/uL   Basophils Relative 1  0 - 1 %   Basophils Absolute 0.0  0.0 - 0.1 K/uL  CBC WITH DIFFERENTIAL      Component Value Range   WBC 11.2 (*) 4.0 - 10.5 K/uL   RBC 4.34  3.87 - 5.11 MIL/uL   Hemoglobin 12.1  12.0 - 15.0 g/dL   HCT 13.2  44.0 - 10.2 %   MCV 83.6  78.0 - 100.0 fL   MCH 27.9  26.0 - 34.0 pg   MCHC 33.3  30.0 - 36.0 g/dL   RDW 72.5  36.6 - 44.0 %   Platelets 207  150 - 400 K/uL   Neutrophils Relative 90 (*) 43 - 77 %   Neutro Abs 10.1 (*) 1.7 - 7.7 K/uL   Lymphocytes Relative 8 (*) 12 - 46 %   Lymphs Abs 0.9  0.7 - 4.0 K/uL   Monocytes Relative 2 (*) 3 - 12 %   Monocytes Absolute 0.3  0.1 - 1.0 K/uL   Eosinophils Relative 0  0 - 5 %   Eosinophils Absolute 0.0  0.0 - 0.7 K/uL  Basophils Relative 0  0 - 1 %   Basophils Absolute 0.0  0.0 - 0.1 K/uL  BASIC METABOLIC PANEL      Component Value Range   Sodium 138  135 - 145 mEq/L   Potassium 4.2  3.5 - 5.1 mEq/L   Chloride 106  96 - 112 mEq/L   CO2 25  19 - 32 mEq/L   Glucose, Bld 153 (*) 70 - 99 mg/dL   BUN 9  6 - 23 mg/dL   Creatinine, Ser 1.19  0.50 - 1.10 mg/dL   Calcium 8.7  8.4 - 14.7 mg/dL   GFR calc non Af Amer >90  >90 mL/min   GFR calc Af Amer >90  >90 mL/min     Disposition: Dural AV fistula Embolization 8/28 with Dr Corliss Skains Pt has done very well overnight No complaints; neuro intact; no hearing abnormalities Dr Corliss Skains has seen and examined pt Plan for dc now Pt has good understanding od all dc instructions Continue home meds Add ASA 81 mg daily F/U: 09/08/12 at 1pm [facility] radiology   Discharge Orders    Future Appointments: Provider: Department: Dept Phone: Center:   09/08/2012 1:00 PM [facility] [place] [phone] [facility]     Future Orders Please Complete By Expires   Diet - low sodium heart  healthy      Increase activity slowly      Scheduling Instructions:   Slowly mobilize more daily; nothing strenuous   Comments:   Slowly mobilize more and more daily; nothing strenuous   Discharge instructions      Comments:   Restful today; follow up appt 09/08/12 at 1245pm at [facility] radiology   Driving Restrictions      Comments:   No driving 2 weeks   Lifting restrictions      Comments:   Nothing over 10 lbs x 2 weeks   Remove dressing in 24 hours      Call MD for:  severe uncontrolled pain      Call MD for:  redness, tenderness, or signs of infection (pain, swelling, redness, odor or green/yellow discharge around incision site)      Call MD for:  difficulty breathing, headache or visual disturbances      Call MD for:  persistant dizziness or light-headedness        Medication List  As of 08/26/2012 11:35 AM   TAKE these medications         acetaminophen 500 MG tablet   Commonly known as: TYLENOL   Take 1,000 mg by mouth daily as needed. For headach and pain      amLODipine 10 MG tablet   Commonly known as: NORVASC   Take 10 mg by mouth every morning.      benazepril 20 MG tablet   Commonly known as: LOTENSIN   Take 20 mg by mouth every morning.      cholecalciferol 1000 UNITS tablet   Commonly known as: VITAMIN D   Take 1,000 Units by mouth daily.      cycloSPORINE 0.05 % ophthalmic emulsion   Commonly known as: RESTASIS   Place 1 drop into both eyes 2 (two) times daily.      fish oil-omega-3 fatty acids 1000 MG capsule   Take 2 g by mouth daily.      hydrochlorothiazide 12.5 MG capsule   Commonly known as: MICROZIDE   Take 12.5 mg by mouth daily.      loratadine 10 MG tablet  Commonly known as: CLARITIN   Take 10 mg by mouth daily as needed. For allergies             Signed: Darlisha Kelm A 08/26/2012, 11:35 AM

## 2012-08-26 NOTE — Care Management Note (Signed)
    Page 1 of 1   08/26/2012     12:30:45 PM   CARE MANAGEMENT NOTE 08/26/2012  Patient:  [facility]   Account Number:  [id number]  Date Initiated:  08/26/2012  Documentation initiated by:  [facility]  Subjective/Objective Assessment:   Admitted post dural av fistula embolization of rt PICA.     Action/Plan:   able to ambulate around unit   Anticipated DC Date:  08/26/2012   Anticipated DC Plan:  HOME/SELF CARE         Choice offered to / List presented to:             Status of service:  Completed, signed off Medicare Important Message given?   (If response is "NO", the following Medicare IM given date fields will be blank) Date Medicare IM given:   Date Additional Medicare IM given:    Discharge Disposition:  HOME/SELF CARE  Per UR Regulation:  Reviewed for med. necessity/level of care/duration of stay  If discussed at Long Length of Stay Meetings, dates discussed:    Comments:

## 2012-08-26 NOTE — Plan of Care (Signed)
Problem: Phase II Progression Outcomes Goal: Progress activity as tolerated unless otherwise ordered Outcome: Completed/Met Date Met:  08/26/12 Ambulated around unit Goal: Obtain order to discontinue catheter if appropriate Outcome: Completed/Met Date Met:  08/26/12 Pt voided 75 mL after removal

## 2012-08-26 NOTE — Progress Notes (Signed)
1 Day Post-Op  Subjective: Dural ArterioVenous Fistula embolization 8/28 Pt has done very well overnight No complaints  Objective: Vital signs in last 24 hours: Temp:  [97.4 F (36.3 C)-99.1 F (37.3 C)] 98 F (36.7 C) (08/29 0700) Pulse Rate:  [80-91] 80  (08/29 0700) Resp:  [13-31] 17  (08/29 0600) BP: (100-132)/(52-63) 108/62 mmHg (08/29 0700) SpO2:  [91 %-98 %] 96 % (08/29 0700) Arterial Line BP: (96-122)/(46-63) 118/55 mmHg (08/29 0700) Weight:  [257 lb 15 oz (117 kg)] 257 lb 15 oz (117 kg) (08/28 1414)    Intake/Output from previous day: 08/28 0701 - 08/29 0700 In: 2426.3 [P.O.:360; I.V.:2066.3] Out: 2425 [Urine:2425] Intake/Output this shift:    PE:  VSS; afeb Wbc 11.8 (dose of decadron yesterday); no sxs Other labs wnl Appropriate; A/O Face symmetrical; smile= Moves all 4s Rt groin site NT; no bleeding; no hematoma 2+ pulses Rt foot  Lab Results:   Basename 08/26/12 0350 08/25/12 1303  WBC 11.2* 6.8  HGB 12.1 12.3  HCT 36.3 36.4  PLT 207 193   BMET  Basename 08/26/12 0350  NA 138  K 4.2  CL 106  CO2 25  GLUCOSE 153*  BUN 9  CREATININE 0.65  CALCIUM 8.7   PT/INR No results found for this basename: LABPROT:2,INR:2 in the last 72 hours ABG No results found for this basename: PHART:2,PCO2:2,PO2:2,HCO3:2 in the last 72 hours  Studies/Results: No results found.  Anti-infectives: Anti-infectives    None      Assessment/Plan: s/p Procedure(s) (LRB): RADIOLOGY WITH ANESTHESIA (N/A)  Dural AV fistula embo 8/28 No probs overnight Pt doing well Plan to dc after Dr Corliss Skains has seen pt  Alexsys Eskin A 08/26/2012

## 2012-08-27 ENCOUNTER — Telehealth (HOSPITAL_COMMUNITY): Payer: Self-pay

## 2012-08-27 IMAGING — XA IR ANGIO/CAROTID/CERV BI
1 series · 12 of 24 positions shown · IV contrast (IODINE)
Comparison: Catheter angiogram of 08/28/2011.

CLINICAL DATA: Patient with right-sided headaches.  Pulsatile
tinnitus.  History of prominent posterior fossa dural AV fistula.
Status post partial embolization.

BILATERAL CAROTID ARTERIOGRAPHY AND BILATERAL VERTEBRAL ARTERY
ANGIOGRAMS

[Series 300: ir transcath/emboliz · 12 of 145 slices shown]
[im 7/145]
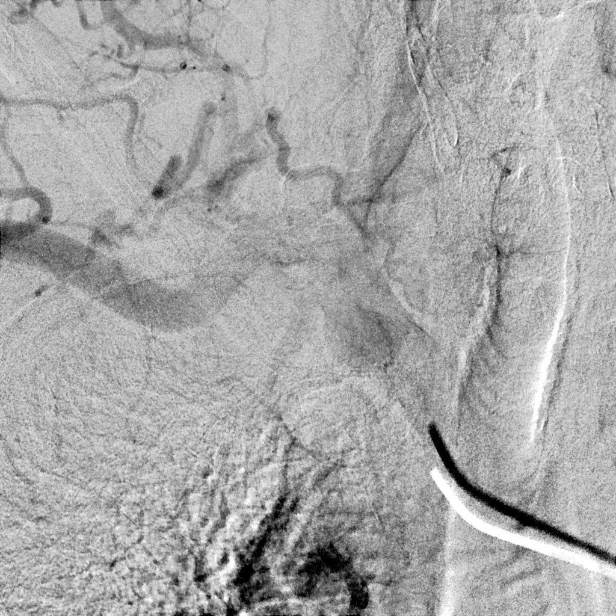
[im 19/145]
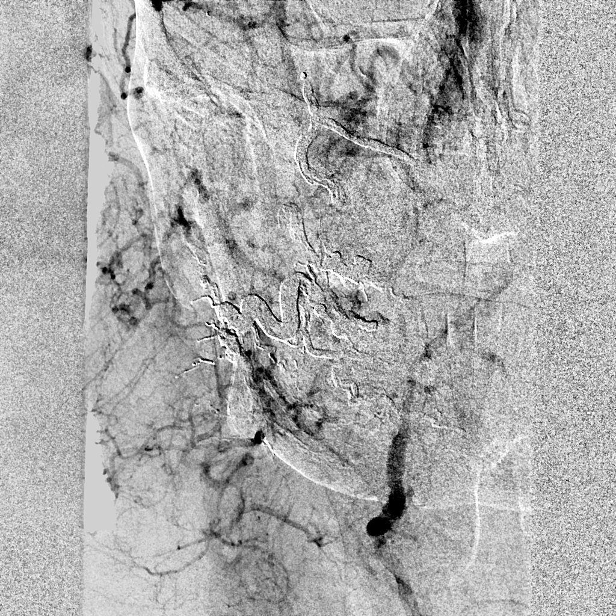
[im 32/145]
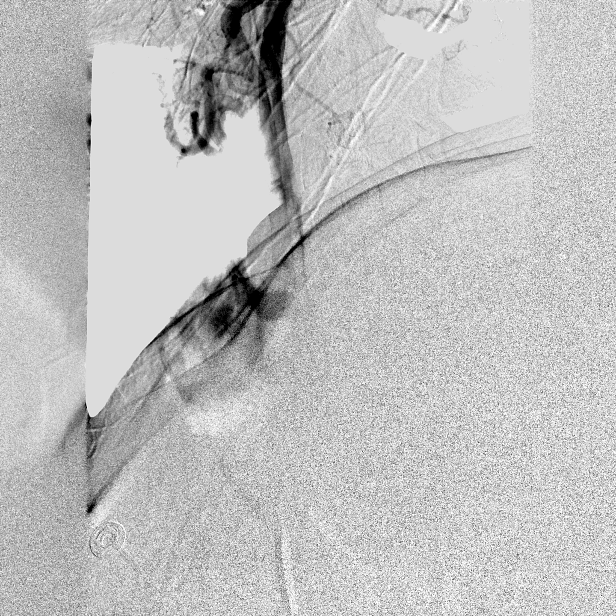
[im 44/145]
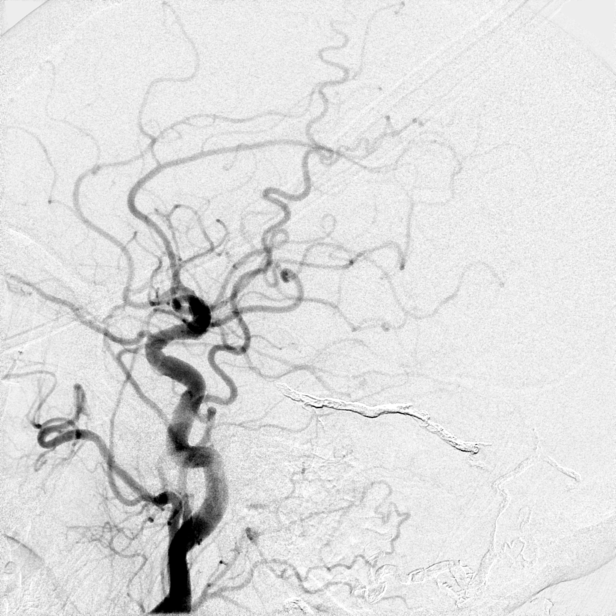
[im 57/145]
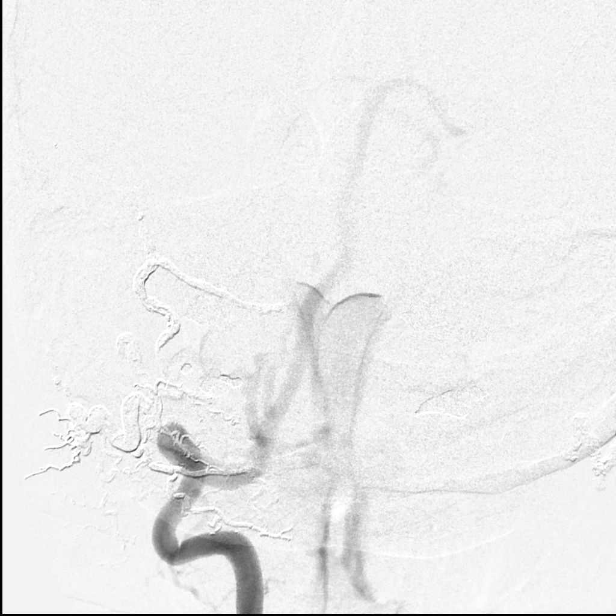
[im 69/145]
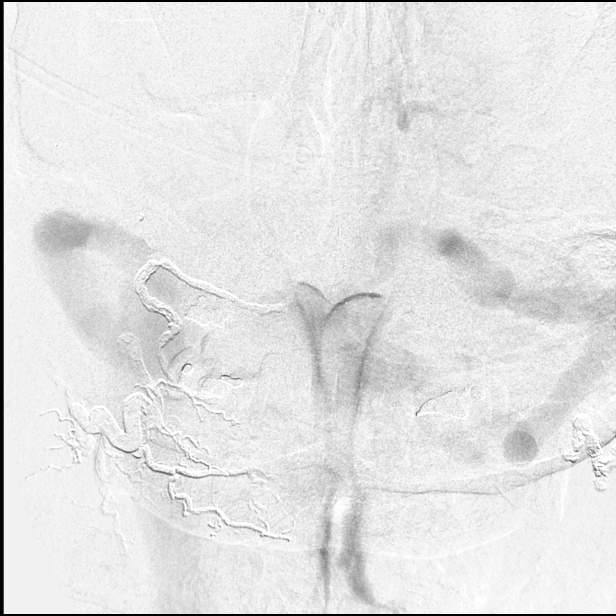
[im 82/145]
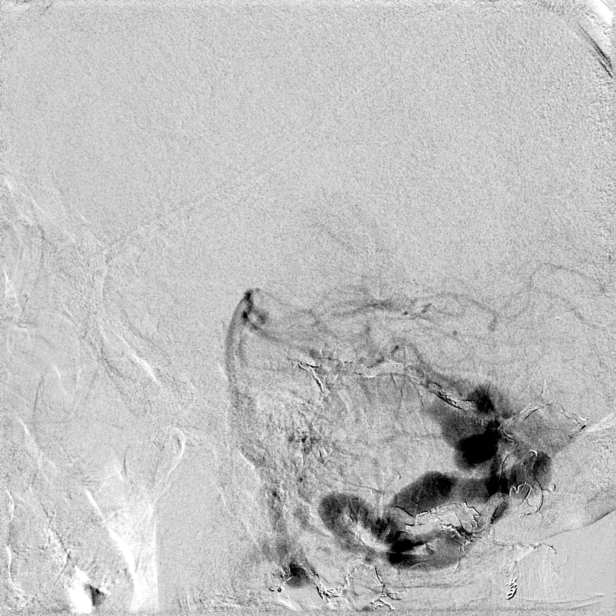
[im 94/145]
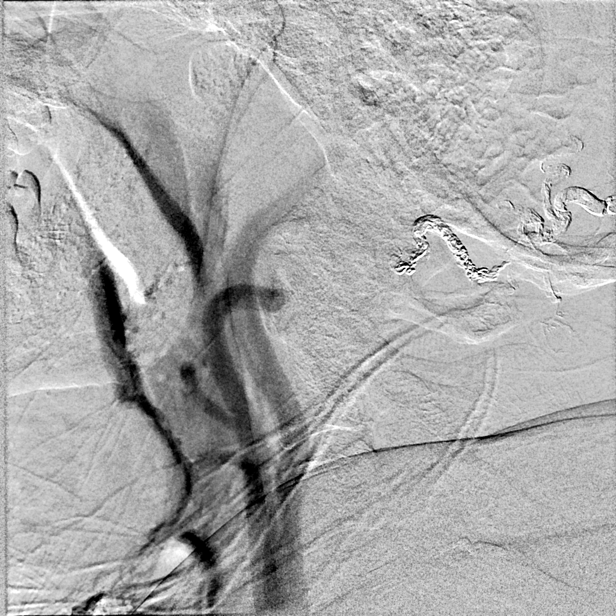
[im 107/145]
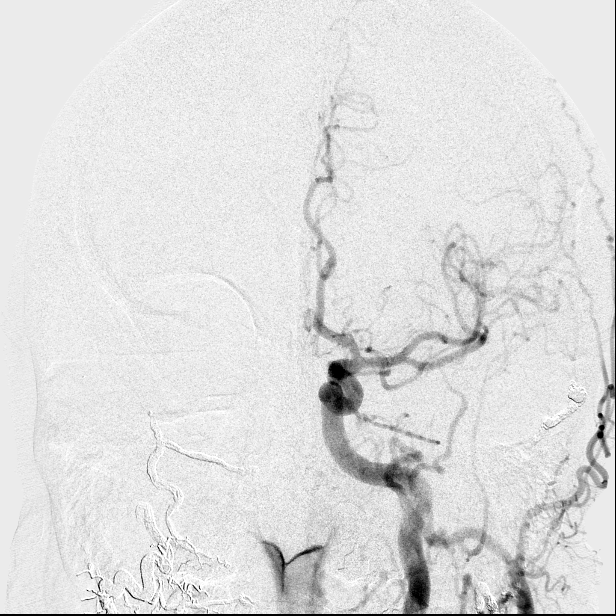
[im 119/145]
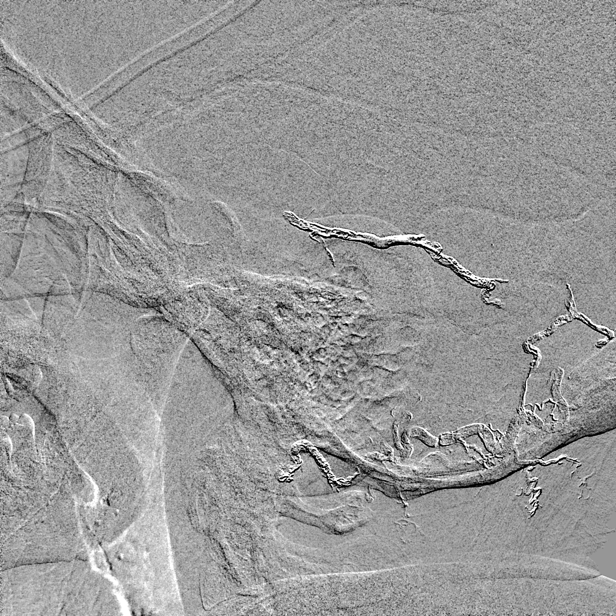
[im 132/145]
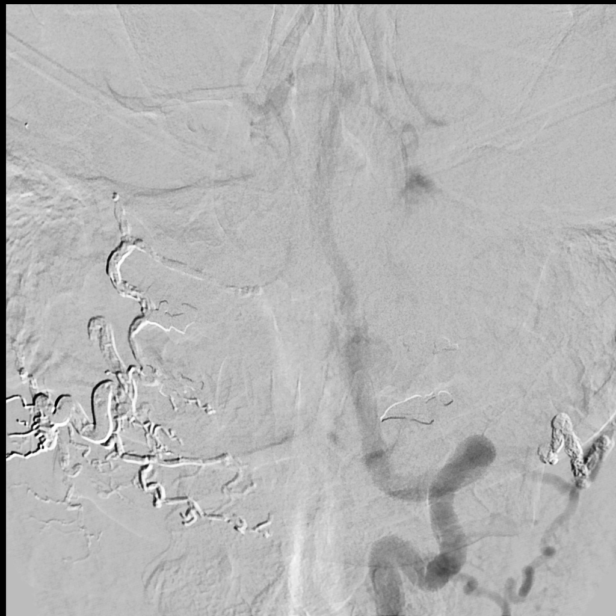
[im 145/145]
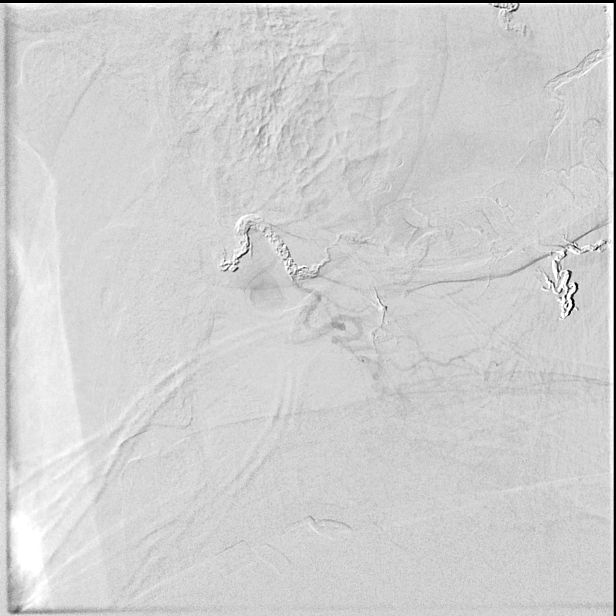

[12 of 24 positions shown; findings below may reference images not displayed]

Following a full explanation of the procedure along with the
potential associated complications, an informed witnessed consent
was obtained.

The right groin was prepped and draped in the usual sterile
fashion.  Thereafter using modified Seldinger technique,
transfemoral access into the right common femoral artery was
obtained without difficulty.  Over a 0.035-inch guidewire, a 5-
French Pinnacle sheath was inserted.  Through this and also over a
0.035-inch guidewire, a 5-French catheter was advanced to the
aortic arch region and selectively positioned in the right common
carotid artery, the right vertebral artery, the left common carotid
artery and left vertebral artery.

There were no acute complications.  The patient tolerated the
procedure well.

Medication utilized:  Versed 1 mg IV.  Fentanyl 25 mcg IV.

Contrast: 8mnipaque-TWW 65 ml.
FINDINGS: The right common carotid arteriogam demonstrates the
right external carotid artery and its major branches to be normal.

The previously embolized occipital artery and the posterior branch
of the middle meningeal artery remain obliterated.  No early
opacification of the dural veins is noted.

The right internal carotid artery at the bulb to the cranial skull
base opacifies normally.

The petrous, the cavernous and the supraclinoid segments are
normal.

The right middle and the right anterior cerebral arteries opacify
normally into the capillary and venous phases.

The right vertebral artery origin is normal.  The vessel opacifies
normally to the cranial skull base.

Again demonstrated is a prominent right posterior-inferior
cerebellar artery with presence of two focal outpouchings, one in
the proximal third and the other in the middle third. At the distal
aspect, again seen are multiple small arterial feeders projecting
into a prominent vein along the posterior middle aspect with
subsequent drainage into the left transverse sinus.

Distal to this the right vertebrobasilar junction is normal.

The basilar artery, the posterior cerebral arteries, the right
superior cerebellar artery and the anterior-inferior cerebellar
arteries opacify normally into capillary and venous phases.

The left superior cerebellar artery is again seen to feed into a
small meshwork of vessels involving the tentorium cerebellum.
Compared to the previous examination, this appears less prominent.
Unopacified blood is seen in the basilar artery from the
contralateral vertebral artery.

The left common carotid bifurcation demonstrates the left external
carotid artery and its major branches to be normal.

The previously embolized occipital artery and the middle meningeal
artery again demonstrate no evidence of arteriovenous shunting.

The left internal carotid artery at the bulb to the cranial skull
base opacifies normally.

The petrous, the cavernous and the supraclinoid segments are
normal.

The left middle and the left anterior cerebral arteries opacify
normally into the capillary and venous phases.

A small twig from the superior hypophyseal trunk is seen to project
posteriorly underneath the tentorium, with faint subsequent
opacification of the previously described prominent vein.

The left vertebral artery origin is normal.  The vessel opacifies
normally to the cranial skull base.

The petrous, the cavernous and the supraclinoid segments are
normal.

The opacified portions of the basilar artery, the posterior
cerebral arteries, the right superior cerebellar arteries and the
anterior-inferior cerebellar arteries are grossly normally into the
capillary and venous phases.

The previously noted musculoskeletal branch from the vertebral
artery at the level of C1 is no longer seen to supply the dural
fistula.
IMPRESSION: 1.  Interval regression of flow into the fistula through the right
posterior inferior cerebellar artery, with no change in the
proximal flow-related aneurysms.
2.  Interval occlusion of the musculoskeletal branch from the
distal left vertebral artery supplying the fistula.

3.  Interval decreased prominence of the left superior cerebellar
artery supplying the mildly diminished fistulous communication
below the left tentorium cerebellum.
4.  Absent flow into the fistula from the previously embolized
branches of the external carotid artery branches bilaterally.

The above findings were reviewed with the patient and the family.
In view of the angiographic improvement, and the patient being
asymptomatic, it was decided to continue with conservative
management in the form of followup.  A follow-up arteriogram will
be tentatively scheduled for 6 months from now.  The patient has
been asked to call for neurologic symptoms or for any concerns she
might have.

## 2012-08-27 NOTE — Telephone Encounter (Signed)
I received a fax from [place] with Tedd Sias asking for me to call [phone] in regards to a pre-cert for Katie King.  I left a vm for her stating exactly what is on the Pre-auth I received from [place] which has already been scanned in.

## 2012-09-08 ENCOUNTER — Ambulatory Visit (HOSPITAL_COMMUNITY)
Admit: 2012-09-08 | Discharge: 2012-09-08 | Disposition: A | Discharge status: Home / Self Care | Payer: 59 | Attending: Interventional Radiology | Admitting: Interventional Radiology

## 2013-01-26 ENCOUNTER — Other Ambulatory Visit (HOSPITAL_COMMUNITY): Payer: Self-pay | Admitting: Interventional Radiology

## 2013-01-26 DIAGNOSIS — I770 Arteriovenous fistula, acquired: Secondary | ICD-10-CM

## 2013-02-18 ENCOUNTER — Other Ambulatory Visit: Payer: Self-pay | Admitting: Radiology

## 2013-02-25 ENCOUNTER — Other Ambulatory Visit (HOSPITAL_COMMUNITY): Payer: Self-pay | Admitting: Interventional Radiology

## 2013-02-25 ENCOUNTER — Encounter (HOSPITAL_COMMUNITY): Payer: Self-pay

## 2013-02-25 ENCOUNTER — Ambulatory Visit (HOSPITAL_COMMUNITY)
Admission: RE | Admit: 2013-02-25 | Discharge: 2013-02-25 | Disposition: A | Discharge status: Home / Self Care | Payer: BC Managed Care – PPO | Source: Ambulatory Visit | Attending: Interventional Radiology | Admitting: Interventional Radiology

## 2013-02-25 DIAGNOSIS — I770 Arteriovenous fistula, acquired: Secondary | ICD-10-CM

## 2013-02-25 DIAGNOSIS — H9319 Tinnitus, unspecified ear: Secondary | ICD-10-CM | POA: Insufficient documentation

## 2013-02-25 LAB — BASIC METABOLIC PANEL
BUN: 15 mg/dL (ref 6–23)
Chloride: 103 mEq/L (ref 96–112)
GFR calc non Af Amer: 76 mL/min — ABNORMAL LOW (ref >90)
Glucose, Bld: 126 mg/dL — ABNORMAL HIGH (ref 70–99)
Potassium: 3.8 mEq/L (ref 3.5–5.1)

## 2013-02-25 LAB — CBC WITH DIFFERENTIAL/PLATELET
Basophils Relative: 1 % (ref 0–1)
Eosinophils Absolute: 0.4 10*3/uL (ref 0.0–0.7)
HCT: 44.7 % (ref 36.0–46.0)
Hemoglobin: 15.4 g/dL — ABNORMAL HIGH (ref 12.0–15.0)
MCH: 28.5 pg (ref 26.0–34.0)
MCHC: 34.5 g/dL (ref 30.0–36.0)
Monocytes Absolute: 0.5 10*3/uL (ref 0.1–1.0)
Monocytes Relative: 6 % (ref 3–12)

## 2013-02-25 MED ORDER — FENTANYL CITRATE 0.05 MG/ML IJ SOLN
INTRAMUSCULAR | Status: AC
  Filled 2013-02-25: qty 4

## 2013-02-25 MED ORDER — SODIUM CHLORIDE 0.9 % IV SOLN: INTRAVENOUS | Status: AC

## 2013-02-25 MED ORDER — SODIUM CHLORIDE 0.9 % IV SOLN
Freq: Once | INTRAVENOUS | Status: AC
  Administered 2013-02-25: 1000 mL via INTRAVENOUS

## 2013-02-25 MED ORDER — MIDAZOLAM HCL 2 MG/2ML IJ SOLN
INTRAMUSCULAR | Status: AC | PRN
  Administered 2013-02-25: 1 mg via INTRAVENOUS
  Administered 2013-02-25: 0.5 mg via INTRAVENOUS

## 2013-02-25 MED ORDER — HEPARIN SODIUM (PORCINE) 1000 UNIT/ML IJ SOLN
INTRAMUSCULAR | Status: AC | PRN
  Administered 2013-02-25 (×2): 500 [IU] via INTRAVENOUS

## 2013-02-25 MED ORDER — MIDAZOLAM HCL 2 MG/2ML IJ SOLN
INTRAMUSCULAR | Status: AC
  Filled 2013-02-25: qty 4

## 2013-02-25 MED ORDER — FENTANYL CITRATE 0.05 MG/ML IJ SOLN
INTRAMUSCULAR | Status: AC | PRN
  Administered 2013-02-25: 25 ug via INTRAVENOUS

## 2013-02-25 MED ORDER — IOHEXOL 300 MG/ML  SOLN
150.0000 mL | Freq: Once | INTRAMUSCULAR | Status: AC | PRN
  Administered 2013-02-25: 115 mL via INTRAVENOUS

## 2013-02-25 NOTE — Procedures (Signed)
S/P 4 vessel cerebral arteriogram  RT CFA approach  Findings  1Sig reduced flow through Lt sigmoid DAVF from Rt ECA occipital br,Lt superior cerebellar  Artery  and lt ICA meningohypophyseal trunk

## 2013-02-25 NOTE — H&P (Signed)
Katie King is an 62 y.o. female.   Chief Complaint: Previous embolization of R Posterior Inferior Cerebellar Artery  supplying posterior fossa dural fistula with obliteration of 2 associated aneurysms performed 08/2012. Scheduled for recheck cerebral arteriogram today HPI: HTN; prev cerebral embolizarion  Past Medical History  Diagnosis Date  . Hypertension     Past Surgical History  Procedure Laterality Date  . Myringoplasty    . Cerebral embolization      multiple emobolization with onyx  . Dilation and curettage of uterus  1984  . Hernia repair      at age 57  . Dural av fistula      embolization    No family history on file. Social History:  reports that she has never smoked. She does not have any smokeless tobacco history on file. She reports that she drinks about 0.6 ounces of alcohol per week. She reports that she does not use illicit drugs.  Allergies:  Allergies  Allergen Reactions  . Codeine Nausea Only  . Sulfa Antibiotics Nausea Only  . Latex Rash    Contact dermatitis     (Not in a hospital admission)  No results found for this or any previous visit (from the past 48 hour(s)). No results found.  Review of Systems  Constitutional: Negative for fever.  Eyes: Negative for blurred vision and double vision.  Respiratory: Negative for cough and shortness of breath.   Cardiovascular: Negative for chest pain.  Gastrointestinal: Negative for nausea, vomiting and abdominal pain.  Neurological: Negative for dizziness, tingling, weakness and headaches.  Psychiatric/Behavioral: Negative for memory loss.    There were no vitals taken for this visit. Physical Exam  Constitutional: She is oriented to person, place, and time. She appears well-developed and well-nourished.  Eyes: EOM are normal.  Neck: Normal range of motion.  Cardiovascular: Normal rate, regular rhythm and normal heart sounds.   No murmur heard. Respiratory: Effort normal and breath sounds  normal. She has no wheezes.  GI: Soft. Bowel sounds are normal. There is no tenderness.  Musculoskeletal: Normal range of motion. She exhibits no edema.  Neurological: She is alert and oriented to person, place, and time. No cranial nerve deficit. Coordination normal.  Skin: Skin is warm and dry.  Psychiatric: She has a normal mood and affect. Her behavior is normal. Judgment and thought content normal.     Assessment/Plan Embolization R PICA supplying dural fistula and 2 assoc aneurysms 08/2012 For recheck cerebral arteriogram Pt aware of procedure benefits and risks and agreeable to proceed Consent signed and in chart  Seng Larch A 02/25/2013, 7:28 AM

## 2013-04-22 ENCOUNTER — Other Ambulatory Visit (HOSPITAL_COMMUNITY)
Admission: RE | Admit: 2013-04-22 | Discharge: 2013-04-22 | Disposition: A | Discharge status: Home / Self Care | Payer: BC Managed Care – PPO | Source: Ambulatory Visit | Attending: Family Medicine | Admitting: Family Medicine

## 2013-04-22 ENCOUNTER — Other Ambulatory Visit: Payer: Self-pay | Admitting: Family Medicine

## 2013-04-22 DIAGNOSIS — Z01419 Encounter for gynecological examination (general) (routine) without abnormal findings: Secondary | ICD-10-CM | POA: Insufficient documentation

## 2013-09-09 ENCOUNTER — Other Ambulatory Visit: Payer: Self-pay | Admitting: Radiology

## 2013-09-09 ENCOUNTER — Other Ambulatory Visit (HOSPITAL_COMMUNITY): Payer: Self-pay | Admitting: Interventional Radiology

## 2013-09-09 DIAGNOSIS — I770 Arteriovenous fistula, acquired: Secondary | ICD-10-CM

## 2013-09-14 ENCOUNTER — Encounter (HOSPITAL_COMMUNITY): Payer: Self-pay

## 2013-09-16 ENCOUNTER — Other Ambulatory Visit (HOSPITAL_COMMUNITY): Payer: Self-pay | Admitting: Interventional Radiology

## 2013-09-16 ENCOUNTER — Ambulatory Visit (HOSPITAL_COMMUNITY)
Admission: RE | Admit: 2013-09-16 | Discharge: 2013-09-16 | Disposition: A | Discharge status: Home / Self Care | Payer: BC Managed Care – PPO | Source: Ambulatory Visit | Attending: Interventional Radiology | Admitting: Interventional Radiology

## 2013-09-16 DIAGNOSIS — I770 Arteriovenous fistula, acquired: Secondary | ICD-10-CM

## 2013-09-16 DIAGNOSIS — Z9889 Other specified postprocedural states: Secondary | ICD-10-CM | POA: Insufficient documentation

## 2013-09-16 DIAGNOSIS — Z09 Encounter for follow-up examination after completed treatment for conditions other than malignant neoplasm: Secondary | ICD-10-CM | POA: Insufficient documentation

## 2013-09-16 DIAGNOSIS — I10 Essential (primary) hypertension: Secondary | ICD-10-CM | POA: Insufficient documentation

## 2013-09-16 DIAGNOSIS — I671 Cerebral aneurysm, nonruptured: Secondary | ICD-10-CM | POA: Insufficient documentation

## 2013-09-16 LAB — CBC WITH DIFFERENTIAL/PLATELET
Basophils Absolute: 0.1 10*3/uL (ref 0.0–0.1)
Basophils Relative: 1 % (ref 0–1)
Eosinophils Absolute: 0.4 10*3/uL (ref 0.0–0.7)
Eosinophils Relative: 6 % — ABNORMAL HIGH (ref 0–5)
Lymphs Abs: 1.9 10*3/uL (ref 0.7–4.0)
MCH: 28 pg (ref 26.0–34.0)
MCHC: 33.7 g/dL (ref 30.0–36.0)
MCV: 83.1 fL (ref 78.0–100.0)
Neutrophils Relative %: 60 % (ref 43–77)
Platelets: 207 10*3/uL (ref 150–400)
RBC: 5.04 MIL/uL (ref 3.87–5.11)
RDW: 13.9 % (ref 11.5–15.5)

## 2013-09-16 LAB — BASIC METABOLIC PANEL
CO2: 27 mEq/L (ref 19–32)
Calcium: 9.6 mg/dL (ref 8.4–10.5)
Creatinine, Ser: 0.78 mg/dL (ref 0.50–1.10)
GFR calc non Af Amer: 89 mL/min — ABNORMAL LOW (ref >90)
Glucose, Bld: 108 mg/dL — ABNORMAL HIGH (ref 70–99)
Sodium: 138 mEq/L (ref 135–145)

## 2013-09-16 LAB — PROTIME-INR
INR: 1.06 (ref 0.00–1.49)
Prothrombin Time: 13.6 seconds (ref 11.6–15.2)

## 2013-09-16 MED ORDER — SODIUM CHLORIDE 0.9 % IV SOLN: INTRAVENOUS | Status: AC

## 2013-09-16 MED ORDER — MIDAZOLAM HCL 2 MG/2ML IJ SOLN
INTRAMUSCULAR | Status: AC
  Filled 2013-09-16: qty 4

## 2013-09-16 MED ORDER — SODIUM CHLORIDE 0.9 % IV SOLN: Freq: Once | INTRAVENOUS | Status: DC

## 2013-09-16 MED ORDER — IOHEXOL 300 MG/ML  SOLN
150.0000 mL | Freq: Once | INTRAMUSCULAR | Status: AC | PRN
  Administered 2013-09-16: 70 mL via INTRAVENOUS

## 2013-09-16 MED ORDER — FENTANYL CITRATE 0.05 MG/ML IJ SOLN
INTRAMUSCULAR | Status: AC
  Filled 2013-09-16: qty 4

## 2013-09-16 MED ORDER — MIDAZOLAM HCL 2 MG/2ML IJ SOLN
INTRAMUSCULAR | Status: AC | PRN
  Administered 2013-09-16 (×2): 0.5 mg via INTRAVENOUS

## 2013-09-16 MED ORDER — FENTANYL CITRATE 0.05 MG/ML IJ SOLN
INTRAMUSCULAR | Status: AC | PRN
  Administered 2013-09-16 (×2): 12.5 ug via INTRAVENOUS

## 2013-09-16 MED ORDER — HEPARIN SOD (PORK) LOCK FLUSH 100 UNIT/ML IV SOLN
INTRAVENOUS | Status: AC | PRN
  Administered 2013-09-16: 1000 [IU] via INTRAVENOUS
  Administered 2013-09-16: 500 [IU] via INTRAVENOUS

## 2013-09-16 NOTE — H&P (Signed)
Katie King is an 62 y.o. female.   Chief Complaint: "I'm having another angiogram" HPI: Patient with history of right pulsatile tinnitus and embolization of right PICA supplying posterior fossa dural fistula/obliteration of two associated aneurysms 08/2012 presents today for follow up cerebral arteriogram.  Past Medical History  Diagnosis Date  . Hypertension     Past Surgical History  Procedure Laterality Date  . Myringoplasty    . Cerebral embolization      multiple emobolization with onyx  . Dilation and curettage of uterus  1984  . Hernia repair      at age 65  . Dural av fistula      embolization    No family history on file. Social History:  reports that she has never smoked. She does not have any smokeless tobacco history on file. She reports that she drinks about 0.6 ounces of alcohol per week. She reports that she does not use illicit drugs.  Allergies:  Allergies  Allergen Reactions  . Codeine Nausea Only  . Sulfa Antibiotics Nausea Only  . Latex Rash    Contact dermatitis    Current outpatient prescriptions:acetaminophen (TYLENOL) 500 MG tablet, Take 1,000 mg by mouth daily as needed. For headach and pain, Disp: , Rfl: ;  amLODipine (NORVASC) 10 MG tablet, Take 10 mg by mouth every morning. , Disp: , Rfl: ;  benazepril (LOTENSIN) 20 MG tablet, Take 20 mg by mouth every morning. , Disp: , Rfl: ;  cholecalciferol (VITAMIN D) 1000 UNITS tablet, Take 1,000 Units by mouth every evening. , Disp: , Rfl:  clobetasol cream (TEMOVATE) 0.05 %, Apply 1 application topically 2 (two) times daily as needed., Disp: , Rfl: ;  cycloSPORINE (RESTASIS) 0.05 % ophthalmic emulsion, Place 1 drop into both eyes 2 (two) times daily., Disp: , Rfl: ;  fish oil-omega-3 fatty acids 1000 MG capsule, Take 2 g by mouth every evening. , Disp: , Rfl: ;  hydrochlorothiazide (MICROZIDE) 12.5 MG capsule, Take 12.5 mg by mouth daily., Disp: , Rfl:  loratadine (CLARITIN) 10 MG tablet, Take 10 mg by mouth  daily as needed. For allergies , Disp: , Rfl: ;  ibuprofen (ADVIL,MOTRIN) 200 MG tablet, Take 200 mg by mouth every 6 (six) hours as needed for pain., Disp: , Rfl:  Current facility-administered medications:0.9 %  sodium chloride infusion, , Intravenous, Once, Robet Leu, PA-C Facility-Administered Medications Ordered in Other Encounters: fentaNYL (SUBLIMAZE) injection 25-50 mcg, 25-50 mcg, Intravenous, Q5 min PRN, Judie Petit, MD;  promethazine (PHENERGAN) injection 6.25-12.5 mg, 6.25-12.5 mg, Intravenous, Q15 min PRN, Judie Petit, MD  Results for orders placed during the hospital encounter of 02/25/13  APTT      Result Value Range   aPTT 29  24 - 37 seconds  BASIC METABOLIC PANEL      Result Value Range   Sodium 141  135 - 145 mEq/L   Potassium 3.8  3.5 - 5.1 mEq/L   Chloride 103  96 - 112 mEq/L   CO2 30  19 - 32 mEq/L   Glucose, Bld 126 (*) 70 - 99 mg/dL   BUN 15  6 - 23 mg/dL   Creatinine, Ser 1.61  0.50 - 1.10 mg/dL   Calcium 09.6  8.4 - 04.5 mg/dL   GFR calc non Af Amer 76 (*) >90 mL/min   GFR calc Af Amer 88 (*) >90 mL/min  CBC WITH DIFFERENTIAL      Result Value Range   WBC 8.3  4.0 - 10.5  K/uL   RBC 5.41 (*) 3.87 - 5.11 MIL/uL   Hemoglobin 15.4 (*) 12.0 - 15.0 g/dL   HCT 08.6  57.8 - 46.9 %   MCV 82.6  78.0 - 100.0 fL   MCH 28.5  26.0 - 34.0 pg   MCHC 34.5  30.0 - 36.0 g/dL   RDW 62.9  52.8 - 41.3 %   Platelets 239  150 - 400 K/uL   Neutrophils Relative % 59  43 - 77 %   Neutro Abs 4.9  1.7 - 7.7 K/uL   Lymphocytes Relative 29  12 - 46 %   Lymphs Abs 2.5  0.7 - 4.0 K/uL   Monocytes Relative 6  3 - 12 %   Monocytes Absolute 0.5  0.1 - 1.0 K/uL   Eosinophils Relative 5  0 - 5 %   Eosinophils Absolute 0.4  0.0 - 0.7 K/uL   Basophils Relative 1  0 - 1 %   Basophils Absolute 0.1  0.0 - 0.1 K/uL  PROTIME-INR      Result Value Range   Prothrombin Time 12.8  11.6 - 15.2 seconds   INR 0.97  0.00 - 1.49   09/16/2013 labs pending Review of Systems   Constitutional: Negative for fever and chills.  HENT: Negative for tinnitus.   Respiratory: Negative for cough and shortness of breath.   Cardiovascular: Negative for chest pain.  Gastrointestinal: Negative for nausea, vomiting and abdominal pain.  Musculoskeletal: Negative for back pain.  Neurological: Negative for dizziness, sensory change, speech change, focal weakness, seizures, loss of consciousness and headaches.  Endo/Heme/Allergies: Does not bruise/bleed easily.    Blood pressure 131/75, pulse 94, temperature 98.2 F (36.8 C), temperature source Oral, resp. rate 18, height 5\' 5"  (1.651 m), weight 240 lb (108.863 kg), SpO2 96.00%. Physical Exam  Constitutional: She is oriented to person, place, and time. She appears well-developed and well-nourished.  Eyes: EOM are normal. Pupils are equal, round, and reactive to light.  Cardiovascular: Normal rate and regular rhythm.   Respiratory: Effort normal and breath sounds normal.  GI: Soft. Bowel sounds are normal. There is no tenderness.  obese  Musculoskeletal: Normal range of motion. She exhibits no edema.  Neurological: She is alert and oriented to person, place, and time. No cranial nerve deficit. Coordination normal.     Assessment/Plan Pt with hx right pulsatile tinnitus and embolization of right PICA supplying posterior fossa dural fistula along with obliteration of 2 associated aneurysms 08/2012. Plan is for follow up cerebral arteriogram today to assess stability. Details/risks of procedure d/w pt with her understanding and consent.  ALLRED,D KEVIN 09/16/2013, 8:34 AM

## 2013-09-16 NOTE — Procedures (Signed)
S/P 4 vessle cerebral arteiogram. Rt CFA approach. Findings. 1.Interval continued decompression of the post fossa fistula from LT SCA,,RT  ECA branches and Lt meningohypophyseal trunk

## 2013-09-16 NOTE — Discharge Instructions (Signed)
Groin Site Care °Refer to this sheet in the next few weeks. These instructions provide you with information on caring for yourself after your procedure. Your caregiver may also give you more specific instructions. Your treatment has been planned according to current medical practices, but problems sometimes occur. Call your caregiver if you have any problems or questions after your procedure. °HOME CARE INSTRUCTIONS °· You may shower 24 hours after the procedure. Remove the bandage (dressing) and gently wash the site with plain soap and water. Gently pat the site dry. °· Do not apply powder or lotion to the site. °· Do not sit in a bathtub, swimming pool, or whirlpool for 5 to 7 days. °· No bending, squatting, or lifting anything over 10 pounds (4.5 kg) as directed by your caregiver. °· Inspect the site at least twice daily. °· Do not drive home if you are discharged the same day of the procedure. Have someone else drive you. °· You may drive 24 hours after the procedure unless otherwise instructed by your caregiver. °What to expect: °· Any bruising will usually fade within 1 to 2 weeks. °· Blood that collects in the tissue (hematoma) may be painful to the touch. It should usually decrease in size and tenderness within 1 to 2 weeks. °SEEK IMMEDIATE MEDICAL CARE IF: °· You have unusual pain at the groin site or down the affected leg. °· You have redness, warmth, swelling, or pain at the groin site. °· You have drainage (other than a small amount of blood on the dressing). °· You have chills. °· You have a fever or persistent symptoms for more than 72 hours. °· You have a fever and your symptoms suddenly get worse. °· Your leg becomes pale, cool, tingly, or numb. °· You have heavy bleeding from the site. Hold pressure on the site. °Document Released: 01/17/2011 Document Revised: 03/08/2012 Document Reviewed: 01/17/2011 °ExitCare® Patient Information ©2014 [employer]. ° °

## 2014-02-09 ENCOUNTER — Telehealth (HOSPITAL_COMMUNITY): Payer: Self-pay | Admitting: Interventional Radiology

## 2014-02-09 NOTE — Telephone Encounter (Signed)
Called pt, left VM for her to call back. Need to check to see if she has insurance and to schedule [place] f/u carotids JM

## 2014-02-23 ENCOUNTER — Other Ambulatory Visit (HOSPITAL_COMMUNITY): Payer: Self-pay | Admitting: Interventional Radiology

## 2014-02-23 ENCOUNTER — Telehealth (HOSPITAL_COMMUNITY): Payer: Self-pay | Admitting: Interventional Radiology

## 2014-02-23 DIAGNOSIS — I671 Cerebral aneurysm, nonruptured: Secondary | ICD-10-CM

## 2014-02-23 NOTE — Telephone Encounter (Signed)
Called pt left VM for her to call to schedule f/u appt JM

## 2014-03-13 ENCOUNTER — Other Ambulatory Visit (HOSPITAL_COMMUNITY): Payer: Self-pay | Admitting: Interventional Radiology

## 2014-03-13 DIAGNOSIS — I671 Cerebral aneurysm, nonruptured: Secondary | ICD-10-CM

## 2014-03-17 ENCOUNTER — Ambulatory Visit (HOSPITAL_COMMUNITY)
Admission: RE | Admit: 2014-03-17 | Discharge: 2014-03-17 | Disposition: A | Discharge status: Home / Self Care | Payer: No Typology Code available for payment source | Source: Ambulatory Visit | Attending: Interventional Radiology | Admitting: Interventional Radiology

## 2014-03-17 DIAGNOSIS — I671 Cerebral aneurysm, nonruptured: Secondary | ICD-10-CM | POA: Diagnosis present

## 2014-03-17 LAB — CREATININE, SERUM
CREATININE: 0.77 mg/dL (ref 0.50–1.10)
GFR calc Af Amer: >90 mL/min (ref >90)
GFR calc non Af Amer: 89 mL/min — ABNORMAL LOW (ref >90)

## 2014-03-17 MED ORDER — GADOBENATE DIMEGLUMINE 529 MG/ML IV SOLN
20.0000 mL | Freq: Once | INTRAVENOUS | Status: AC | PRN
  Administered 2014-03-17: 20 mL via INTRAVENOUS

## 2014-03-23 ENCOUNTER — Telehealth (HOSPITAL_COMMUNITY): Payer: Self-pay | Admitting: Interventional Radiology

## 2014-03-23 NOTE — Telephone Encounter (Signed)
Called pt, left VM. Pt called me right back. Told her that per Deveshwar her unless she was symptomatic he wanted to f/u with another MRI in 6 months. Pt states understanding and is in agreement with this plan of care. JM

## 2014-05-05 ENCOUNTER — Other Ambulatory Visit: Payer: Self-pay | Admitting: Family Medicine

## 2014-05-05 ENCOUNTER — Other Ambulatory Visit (HOSPITAL_COMMUNITY)
Admission: RE | Admit: 2014-05-05 | Discharge: 2014-05-05 | Disposition: A | Discharge status: Home / Self Care | Payer: 59 | Source: Ambulatory Visit | Attending: Family Medicine | Admitting: Family Medicine

## 2014-05-05 DIAGNOSIS — Z124 Encounter for screening for malignant neoplasm of cervix: Secondary | ICD-10-CM | POA: Insufficient documentation

## 2014-10-06 ENCOUNTER — Other Ambulatory Visit (HOSPITAL_COMMUNITY): Payer: Self-pay | Admitting: Interventional Radiology

## 2014-10-06 DIAGNOSIS — I671 Cerebral aneurysm, nonruptured: Secondary | ICD-10-CM

## 2014-10-20 ENCOUNTER — Ambulatory Visit (HOSPITAL_COMMUNITY): Admission: RE | Admit: 2014-10-20 | Payer: No Typology Code available for payment source | Source: Ambulatory Visit

## 2014-10-20 ENCOUNTER — Ambulatory Visit (HOSPITAL_COMMUNITY): Payer: No Typology Code available for payment source

## 2014-11-01 ENCOUNTER — Ambulatory Visit (HOSPITAL_COMMUNITY): Payer: 59

## 2014-11-02 ENCOUNTER — Other Ambulatory Visit (HOSPITAL_COMMUNITY): Payer: Self-pay | Admitting: Interventional Radiology

## 2014-11-02 DIAGNOSIS — I671 Cerebral aneurysm, nonruptured: Secondary | ICD-10-CM

## 2014-11-03 ENCOUNTER — Ambulatory Visit (HOSPITAL_COMMUNITY)
Admission: RE | Admit: 2014-11-03 | Discharge: 2014-11-03 | Disposition: A | Discharge status: Home / Self Care | Payer: 59 | Source: Ambulatory Visit | Attending: Interventional Radiology | Admitting: Interventional Radiology

## 2014-11-03 DIAGNOSIS — I671 Cerebral aneurysm, nonruptured: Secondary | ICD-10-CM

## 2014-11-03 LAB — CREATININE, SERUM
CREATININE: 0.78 mg/dL (ref 0.50–1.10)
GFR calc Af Amer: >90 mL/min (ref >90)
GFR calc non Af Amer: 88 mL/min — ABNORMAL LOW (ref >90)

## 2014-11-03 MED ORDER — GADOBENATE DIMEGLUMINE 529 MG/ML IV SOLN
20.0000 mL | Freq: Once | INTRAVENOUS | Status: AC
  Administered 2014-11-03: 20 mL via INTRAVENOUS

## 2014-11-10 ENCOUNTER — Telehealth (HOSPITAL_COMMUNITY): Payer: Self-pay | Admitting: Interventional Radiology

## 2014-11-10 NOTE — Telephone Encounter (Signed)
Called pt, left VM that her f/u MRI/MRA would be due in 1 yrs time. JM

## 2015-09-21 ENCOUNTER — Other Ambulatory Visit: Payer: Self-pay | Admitting: Family Medicine

## 2015-09-21 ENCOUNTER — Other Ambulatory Visit (HOSPITAL_COMMUNITY)
Admission: RE | Admit: 2015-09-21 | Discharge: 2015-09-21 | Disposition: A | Discharge status: Home / Self Care | Payer: No Typology Code available for payment source | Source: Ambulatory Visit | Attending: Family Medicine | Admitting: Family Medicine

## 2015-09-21 DIAGNOSIS — Z124 Encounter for screening for malignant neoplasm of cervix: Secondary | ICD-10-CM | POA: Diagnosis not present

## 2015-09-24 LAB — CYTOLOGY - PAP

## 2015-11-06 ENCOUNTER — Telehealth (HOSPITAL_COMMUNITY): Payer: Self-pay

## 2015-11-06 NOTE — Telephone Encounter (Signed)
Called to schedule 1 yr f/u MRI/MRA, left message for pt to call back. AW

## 2015-11-07 ENCOUNTER — Other Ambulatory Visit (HOSPITAL_COMMUNITY): Payer: Self-pay | Admitting: Interventional Radiology

## 2015-11-07 DIAGNOSIS — I770 Arteriovenous fistula, acquired: Secondary | ICD-10-CM

## 2015-11-18 ENCOUNTER — Encounter (HOSPITAL_COMMUNITY): Payer: Self-pay | Admitting: Oncology

## 2015-11-18 ENCOUNTER — Emergency Department (HOSPITAL_COMMUNITY)
Admission: EM | Admit: 2015-11-18 | Discharge: 2015-11-18 | Disposition: A | Discharge status: Home / Self Care | Payer: No Typology Code available for payment source | Attending: Emergency Medicine | Admitting: Emergency Medicine

## 2015-11-18 ENCOUNTER — Emergency Department (HOSPITAL_COMMUNITY): Payer: No Typology Code available for payment source

## 2015-11-18 DIAGNOSIS — Z79899 Other long term (current) drug therapy: Secondary | ICD-10-CM | POA: Insufficient documentation

## 2015-11-18 DIAGNOSIS — S91312A Laceration without foreign body, left foot, initial encounter: Secondary | ICD-10-CM | POA: Insufficient documentation

## 2015-11-18 DIAGNOSIS — Y998 Other external cause status: Secondary | ICD-10-CM | POA: Diagnosis not present

## 2015-11-18 DIAGNOSIS — Y9389 Activity, other specified: Secondary | ICD-10-CM | POA: Diagnosis not present

## 2015-11-18 DIAGNOSIS — W208XXA Other cause of strike by thrown, projected or falling object, initial encounter: Secondary | ICD-10-CM | POA: Insufficient documentation

## 2015-11-18 DIAGNOSIS — Y9289 Other specified places as the place of occurrence of the external cause: Secondary | ICD-10-CM | POA: Insufficient documentation

## 2015-11-18 DIAGNOSIS — I10 Essential (primary) hypertension: Secondary | ICD-10-CM | POA: Insufficient documentation

## 2015-11-18 DIAGNOSIS — Z91040 Latex allergy status: Secondary | ICD-10-CM | POA: Insufficient documentation

## 2015-11-18 MED ORDER — LIDOCAINE HCL (PF) 1 % IJ SOLN
5.0000 mL | Freq: Once | INTRAMUSCULAR | Status: AC
  Administered 2015-11-18: 5 mL
  Filled 2015-11-18: qty 5

## 2015-11-18 NOTE — ED Provider Notes (Signed)
CSN: [id number]     Arrival date & time 11/18/15  0630 History   First MD Initiated Contact with Patient 11/18/15 [phone]     Chief Complaint  Patient presents with  . Extremity Laceration     HPI   Katie King is a 64 y.o. female with a PMH of HTN who presents to the ED with laceration to her left foot, which occurred last night around 10:30 PM while she was taking a baking dish out of the refrigerator and dropped the dish on her foot. She states she applied pressure and the bleeding was controlled. She reports minimal pain. She states movement exacerbates her pain. She has not tried anything for symptom relief, though reports she has kept her wound dressed. She denies numbness, weakness, paresthesia. She states her tetanus is up to date. She denies anticoagulant use.   Past Medical History  Diagnosis Date  . Hypertension    Past Surgical History  Procedure Laterality Date  . Myringoplasty    . Cerebral embolization      multiple emobolization with onyx  . Dilation and curettage of uterus  1984  . Hernia repair      at age 59  . Dural av fistula      embolization   History reviewed. No pertinent family history. Social History  Substance Use Topics  . Smoking status: Never Smoker   . Smokeless tobacco: None  . Alcohol Use: 0.6 oz/week    1 Cans of beer per week   OB History    No data available      Review of Systems  Skin: Positive for wound.  Neurological: Negative for weakness and numbness.      Allergies  Codeine; Sulfa antibiotics; and Latex  Home Medications   Prior to Admission medications   Medication Sig Start Date End Date Taking? Authorizing Provider  acetaminophen (TYLENOL) 500 MG tablet Take 1,000 mg by mouth daily as needed. For headach and pain    Historical Provider, MD  amLODipine (NORVASC) 10 MG tablet Take 10 mg by mouth every morning.     Historical Provider, MD  benazepril (LOTENSIN) 20 MG tablet Take 20 mg by mouth every morning.      Historical Provider, MD  cholecalciferol (VITAMIN D) 1000 UNITS tablet Take 1,000 Units by mouth every evening.     Historical Provider, MD  clobetasol cream (TEMOVATE) [id number] % Apply 1 application topically 2 (two) times daily as needed.    Historical Provider, MD  cycloSPORINE (RESTASIS) 0.05 % ophthalmic emulsion Place 1 drop into both eyes 2 (two) times daily.    Historical Provider, MD  fish oil-omega-3 fatty acids 1000 MG capsule Take 2 g by mouth every evening.     Historical Provider, MD  hydrochlorothiazide (MICROZIDE) 12.5 MG capsule Take 12.5 mg by mouth daily.    Historical Provider, MD  ibuprofen (ADVIL,MOTRIN) 200 MG tablet Take 200 mg by mouth every 6 (six) hours as needed for pain.    Historical Provider, MD  loratadine (CLARITIN) 10 MG tablet Take 10 mg by mouth daily as needed. For allergies     Historical Provider, MD    BP 125/62 mmHg  Pulse 83  Temp(Src) 98.3 F (36.8 C) (Oral)  Resp 20  Ht 5\' 5"  (1.651 m)  Wt 240 lb (108.863 kg)  BMI 39.94 kg/m2  SpO2 99% Physical Exam  Constitutional: She is oriented to person, place, and time. She appears well-developed and well-nourished. No distress.  HENT:  Head: Normocephalic and atraumatic.  Right Ear: External ear normal.  Left Ear: External ear normal.  Nose: Nose normal.  Eyes: Conjunctivae and EOM are normal. Right eye exhibits no discharge. Left eye exhibits no discharge. No scleral icterus.  Neck: Normal range of motion. Neck supple.  Cardiovascular: Normal rate, regular rhythm and intact distal pulses.   Pulmonary/Chest: Effort normal and breath sounds normal. No respiratory distress.  Musculoskeletal: Normal range of motion. She exhibits no edema or tenderness.  Neurological: She is alert and oriented to person, place, and time.  Skin: Skin is warm and dry. She is not diaphoretic.  3 cm laceration to medial plantar aspect of left foot, hemostatic.  Psychiatric: She has a normal mood and affect. Her behavior is  normal.  Nursing note and vitals reviewed.   ED Course  .Marland KitchenLaceration Repair Date/Time: 11/18/2015 7:00 AM Performed by: Bernerd Limbo C Authorized by: Bernerd Limbo C Consent: Verbal consent obtained. Risks and benefits: risks, benefits and alternatives were discussed Consent given by: patient Patient understanding: patient states understanding of the procedure being performed Patient consent: the patient's understanding of the procedure matches consent given Procedure consent: procedure consent matches procedure scheduled Relevant documents: relevant documents present and verified Site marked: the operative site was marked Required items: required blood products, implants, devices, and special equipment available Patient identity confirmed: verbally with patient Time out: Immediately prior to procedure a "time out" was called to verify the correct patient, procedure, equipment, support staff and site/side marked as required. Body area: lower extremity Location details: left foot Laceration length: 3 cm Foreign bodies: no foreign bodies Tendon involvement: none Nerve involvement: none Vascular damage: no Anesthesia: local infiltration Local anesthetic: lidocaine 1% without epinephrine Anesthetic total: 4 ml Patient sedated: no Preparation: Patient was prepped and draped in the usual sterile fashion. Irrigation solution: saline Irrigation method: tap Amount of cleaning: standard Debridement: none Degree of undermining: none Skin closure: Ethilon Number of sutures: 3 Technique: simple Approximation: close Approximation difficulty: simple Dressing: 4x4 sterile gauze Patient tolerance: Patient tolerated the procedure well with no immediate complications   (including critical care time)  Labs Review Labs Reviewed - No data to display  Imaging Review Dg Foot Complete Left  11/18/2015  CLINICAL DATA:  Laceration of the foot from shattered class at the level  the first metatarsal. EXAM: LEFT FOOT - COMPLETE 3+ VIEW COMPARISON:  None. FINDINGS: There is no evidence of fracture or dislocation. Palmar and superior calcaneal spurs are noted. Soft tissue swelling of the midfoot is noted. IMPRESSION: No evidence of fracture or dislocation. No evidence of radiopaque foreign bodies within the soft tissues. Soft tissue swelling of the midfoot. Electronically Signed   By: Fidela Salisbury M.D.   On: 11/18/2015 07:44     I have personally reviewed and evaluated these images as part of my medical decision-making.   EKG Interpretation None      MDM   Final diagnoses:  Laceration of left foot, initial encounter    64 year old female presents with left foot laceration, which occurred last night. Reports minimal pain. Denies numbness, weakness, paresthesia. States her tetanus is up to date. Denies anticoagulant use. On exam, patient has a 3 cm laceration to the medial plantar aspect of her left foot, hemostatic.  Will obtain imaging of left foot to ensure no FB. Imaging negative for fracture, dislocation, radiopaque foreign body.  Laceration cleaned, repaired, and dressed, which the patient tolerated well. Patient to follow-up with PCP in 1 week  for suture removal. Return precautions discussed. Patient verbalizes her understanding and is in agreement with plan.   BP 125/62 mmHg  Pulse 83  Temp(Src) 98.3 F (36.8 C) (Oral)  Resp 20  Ht 5\' 5"  (1.651 m)  Wt 240 lb (108.863 kg)  BMI 39.94 kg/m2  SpO2 99%     Marella Chimes, PA-C 11/18/15 1647  Everlene Balls, MD 11/18/15 1800

## 2015-11-18 NOTE — Discharge Instructions (Signed)
1. Medications: usual home medications 2. Treatment: rest, drink plenty of fluids, ice, elevate, change dressing daily, keep dressing clean and dry, wear shoes 3. Follow Up: please followup with your primary doctor 1 week for suture removal; please return to the ER for severe pain, numbness, persistent bleeding, signs of infection (redness, swelling, heat, discharge)   Laceration Care, Adult A laceration is a cut that goes through all of the layers of the skin and into the tissue that is right under the skin. Some lacerations heal on their own. Others need to be closed with stitches (sutures), staples, skin adhesive strips, or skin glue. Proper laceration care minimizes the risk of infection and helps the laceration to heal better. HOW TO CARE FOR YOUR LACERATION If sutures or staples were used:  Keep the wound clean and dry.  If you were given a bandage (dressing), you should change it at least one time per day or as told by your health care provider. You should also change it if it becomes wet or dirty.  Keep the wound completely dry for the first 24 hours or as told by your health care provider. After that time, you may shower or bathe. However, make sure that the wound is not soaked in water until after the sutures or staples have been removed.  Clean the wound one time each day or as told by your health care provider:  Wash the wound with soap and water.  Rinse the wound with water to remove all soap.  Pat the wound dry with a clean towel. Do not rub the wound.  After cleaning the wound, apply a thin layer of antibiotic ointmentas told by your health care provider. This will help to prevent infection and keep the dressing from sticking to the wound.  Have the sutures or staples removed as told by your health care provider. If skin adhesive strips were used:  Keep the wound clean and dry.  If you were given a bandage (dressing), you should change it at least one time per day or as  told by your health care provider. You should also change it if it becomes dirty or wet.  Do not get the skin adhesive strips wet. You may shower or bathe, but be careful to keep the wound dry.  If the wound gets wet, pat it dry with a clean towel. Do not rub the wound.  Skin adhesive strips fall off on their own. You may trim the strips as the wound heals. Do not remove skin adhesive strips that are still stuck to the wound. They will fall off in time. If skin glue was used:  Try to keep the wound dry, but you may briefly wet it in the shower or bath. Do not soak the wound in water, such as by swimming.  After you have showered or bathed, gently pat the wound dry with a clean towel. Do not rub the wound.  Do not do any activities that will make you sweat heavily until the skin glue has fallen off on its own.  Do not apply liquid, cream, or ointment medicine to the wound while the skin glue is in place. Using those may loosen the film before the wound has healed.  If you were given a bandage (dressing), you should change it at least one time per day or as told by your health care provider. You should also change it if it becomes dirty or wet.  If a dressing is placed over the  wound, be careful not to apply tape directly over the skin glue. Doing that may cause the glue to be pulled off before the wound has healed.  Do not pick at the glue. The skin glue usually remains in place for 5-10 days, then it falls off of the skin. General Instructions  Take over-the-counter and prescription medicines only as told by your health care provider.  If you were prescribed an antibiotic medicine or ointment, take or apply it as told by your doctor. Do not stop using it even if your condition improves.  To help prevent scarring, make sure to cover your wound with sunscreen whenever you are outside after stitches are removed, after adhesive strips are removed, or when glue remains in place and the wound  is healed. Make sure to wear a sunscreen of at least 30 SPF.  Do not scratch or pick at the wound.  Keep all follow-up visits as told by your health care provider. This is important.  Check your wound every day for signs of infection. Watch for:  Redness, swelling, or pain.  Fluid, blood, or pus.  Raise (elevate) the injured area above the level of your heart while you are sitting or lying down, if possible. SEEK MEDICAL CARE IF:  You received a tetanus shot and you have swelling, severe pain, redness, or bleeding at the injection site.  You have a fever.  A wound that was closed breaks open.  You notice a bad smell coming from your wound or your dressing.  You notice something coming out of the wound, such as wood or glass.  Your pain is not controlled with medicine.  You have increased redness, swelling, or pain at the site of your wound.  You have fluid, blood, or pus coming from your wound.  You notice a change in the color of your skin near your wound.  You need to change the dressing frequently due to fluid, blood, or pus draining from the wound.  You develop a new rash.  You develop numbness around the wound. SEEK IMMEDIATE MEDICAL CARE IF:  You develop severe swelling around the wound.  Your pain suddenly increases and is severe.  You develop painful lumps near the wound or on skin that is anywhere on your body.  You have a red streak going away from your wound.  The wound is on your hand or foot and you cannot properly move a finger or toe.  The wound is on your hand or foot and you notice that your fingers or toes look pale or bluish.   This information is not intended to replace advice given to you by your health care provider. Make sure you discuss any questions you have with your health care provider.   Document Released: 12/15/2005 Document Revised: 05/01/2015 Document Reviewed: 12/11/2014 Elsevier Interactive Patient Education [employer].

## 2015-11-18 NOTE — ED Notes (Signed)
Per pt she had a glass dish fall out of the fridge and shattered on the floor.  Pt has a laceration to her left foot.  Bleeding is controlled.

## 2015-11-18 NOTE — ED Notes (Signed)
Pt has laceration to bottom of left foot that occurred last PM while cooking. Bleeding is controlled. No blood thinners. Wound was cleaned with saline.

## 2015-11-30 ENCOUNTER — Ambulatory Visit (HOSPITAL_COMMUNITY): Payer: No Typology Code available for payment source

## 2015-11-30 ENCOUNTER — Ambulatory Visit (HOSPITAL_COMMUNITY)
Admission: RE | Admit: 2015-11-30 | Discharge: 2015-11-30 | Disposition: A | Discharge status: Home / Self Care | Payer: No Typology Code available for payment source | Source: Ambulatory Visit | Attending: Interventional Radiology | Admitting: Interventional Radiology

## 2015-11-30 DIAGNOSIS — I82890 Acute embolism and thrombosis of other specified veins: Secondary | ICD-10-CM | POA: Diagnosis not present

## 2015-11-30 DIAGNOSIS — I770 Arteriovenous fistula, acquired: Secondary | ICD-10-CM

## 2015-11-30 LAB — CREATININE, SERUM
CREATININE: 0.84 mg/dL (ref 0.44–1.00)
GFR calc Af Amer: >60 mL/min (ref >60)
GFR calc non Af Amer: >60 mL/min (ref >60)

## 2015-11-30 MED ORDER — GADOBENATE DIMEGLUMINE 529 MG/ML IV SOLN
20.0000 mL | Freq: Once | INTRAVENOUS | Status: AC
  Administered 2015-11-30: 20 mL via INTRAVENOUS

## 2016-11-28 ENCOUNTER — Other Ambulatory Visit (HOSPITAL_COMMUNITY): Payer: Self-pay | Admitting: Interventional Radiology

## 2016-11-28 DIAGNOSIS — I770 Arteriovenous fistula, acquired: Secondary | ICD-10-CM

## 2016-12-19 ENCOUNTER — Ambulatory Visit (HOSPITAL_COMMUNITY): Payer: BLUE CROSS/BLUE SHIELD

## 2016-12-19 ENCOUNTER — Encounter (HOSPITAL_COMMUNITY): Payer: Self-pay

## 2016-12-19 ENCOUNTER — Ambulatory Visit (HOSPITAL_COMMUNITY)
Admission: RE | Admit: 2016-12-19 | Discharge: 2016-12-19 | Disposition: A | Discharge status: Home / Self Care | Payer: BLUE CROSS/BLUE SHIELD | Source: Ambulatory Visit | Attending: Interventional Radiology | Admitting: Interventional Radiology

## 2016-12-19 DIAGNOSIS — I671 Cerebral aneurysm, nonruptured: Secondary | ICD-10-CM | POA: Insufficient documentation

## 2016-12-19 DIAGNOSIS — G9389 Other specified disorders of brain: Secondary | ICD-10-CM | POA: Diagnosis not present

## 2016-12-19 DIAGNOSIS — I770 Arteriovenous fistula, acquired: Secondary | ICD-10-CM

## 2016-12-19 DIAGNOSIS — H059 Unspecified disorder of orbit: Secondary | ICD-10-CM | POA: Diagnosis not present

## 2016-12-19 LAB — CREATININE, SERUM
CREATININE: 0.86 mg/dL (ref 0.44–1.00)
GFR calc Af Amer: >60 mL/min (ref >60)

## 2016-12-19 MED ORDER — GADOBENATE DIMEGLUMINE 529 MG/ML IV SOLN
20.0000 mL | Freq: Once | INTRAVENOUS | Status: AC | PRN
  Administered 2016-12-19: 20 mL via INTRAVENOUS

## 2017-01-06 ENCOUNTER — Other Ambulatory Visit (HOSPITAL_COMMUNITY): Payer: Self-pay | Admitting: Interventional Radiology

## 2017-01-06 ENCOUNTER — Telehealth (HOSPITAL_COMMUNITY): Payer: Self-pay

## 2017-01-06 DIAGNOSIS — I770 Arteriovenous fistula, acquired: Secondary | ICD-10-CM

## 2017-01-06 NOTE — Telephone Encounter (Signed)
Called to schedule consult. Left message. AW

## 2017-01-08 ENCOUNTER — Ambulatory Visit (HOSPITAL_COMMUNITY)
Admission: RE | Admit: 2017-01-08 | Discharge: 2017-01-08 | Disposition: A | Discharge status: Home / Self Care | Payer: BLUE CROSS/BLUE SHIELD | Source: Ambulatory Visit | Attending: Interventional Radiology | Admitting: Interventional Radiology

## 2017-01-08 ENCOUNTER — Encounter (HOSPITAL_COMMUNITY): Payer: Self-pay | Admitting: Radiology

## 2017-01-08 DIAGNOSIS — I770 Arteriovenous fistula, acquired: Secondary | ICD-10-CM

## 2017-01-08 HISTORY — PX: IR GENERIC HISTORICAL: IMG1180011

## 2017-01-12 ENCOUNTER — Encounter (HOSPITAL_COMMUNITY): Payer: Self-pay | Admitting: Interventional Radiology

## 2017-02-12 ENCOUNTER — Telehealth (HOSPITAL_COMMUNITY): Payer: Self-pay

## 2017-02-12 NOTE — Telephone Encounter (Signed)
Pt went to see ophthalmologist and want an orbital mri done along with her next f/u mri scan. AW

## 2017-03-13 ENCOUNTER — Encounter: Payer: Self-pay | Admitting: Allergy

## 2017-03-13 ENCOUNTER — Ambulatory Visit (INDEPENDENT_AMBULATORY_CARE_PROVIDER_SITE_OTHER): Payer: BLUE CROSS/BLUE SHIELD | Admitting: Allergy

## 2017-03-13 VITALS — BP 120/74 | HR 109 | Temp 97.7°F | Ht 64.0 in | Wt 238.2 lb

## 2017-03-13 DIAGNOSIS — L2089 Other atopic dermatitis: Secondary | ICD-10-CM

## 2017-03-13 DIAGNOSIS — L508 Other urticaria: Secondary | ICD-10-CM

## 2017-03-13 DIAGNOSIS — J309 Allergic rhinitis, unspecified: Secondary | ICD-10-CM

## 2017-03-13 DIAGNOSIS — H1010 Acute atopic conjunctivitis, unspecified eye: Secondary | ICD-10-CM

## 2017-03-13 NOTE — Progress Notes (Signed)
New Patient Note  RE: Katie King MRN: [id number] DOB: 04/01/1951 Date of Office Visit: 03/13/2017  Referring provider: Leighton Ruff, MD Primary care provider: Gerrit Heck, MD  Chief Complaint: rash  History of present illness: Katie King is a 66 y.o. female presenting today for consultation for rash.   Last year Oct 2017 she developed flare of hives.  She has had hives before with flares in Aug 2016 and Nov 2016.  She describes the rash as red raised bumps and patches that is itchy.  She does report scratching and breaking the skin which has left some scarring as it healed.   No swelling associated with rash or joint pain/aches, or fever .  She denies any preceding illnesses, new foods, changes in medication, stings or change in soaps/lotions/detergents.   She saw her dermatologist regarding the rash and he recommended she do all scent-free products.  He also recommended xyzal 2 tabs a day which she did for about a month and then she went down to 1 tab a day as well as a triamcinolone.  She has also received steroid shot and prednisone taper before which has helped.  She has not been able to identify any food triggers.    She stopped her Xyzal several days ago and she reports she has been sneezing and somewhat itchier.  She also reports watery eyes however attributes to dry eye.     She works in a [employer]il [employer] and realized she was washing her scrubs at work who doesn't use scent-free detergents thus she now washes scrubs at home and does feel somewhat improved.    She also has eczema for which she has used clobetasol and currently triamcinolone.  She tries to stay well moisturized.   No history of asthma.     Review of systems: Review of Systems  Constitutional: Negative for chills, fever and malaise/fatigue.  HENT: Negative for congestion, ear discharge, ear pain, nosebleeds, sinus pain, sore throat and tinnitus.   Eyes: Negative for discharge and redness.    Respiratory: Negative for cough, shortness of breath and wheezing.   Cardiovascular: Negative for chest pain.  Gastrointestinal: Negative for abdominal pain, heartburn, nausea and vomiting.  Musculoskeletal: Negative for joint pain and myalgias.  Skin: Positive for itching and rash.  Neurological: Negative for headaches.   All other systems negative unless noted above in HPI  Past medical history: Past Medical History:  Diagnosis Date  . Hypertension     Past surgical history: Past Surgical History:  Procedure Laterality Date  . CEREBRAL EMBOLIZATION     multiple emobolization with onyx  . DILATION AND CURETTAGE OF UTERUS  1984  . dural av fistula     embolization  . HERNIA REPAIR     at age 21  . IR GENERIC HISTORICAL  01/08/2017   IR RADIOLOGIST EVAL & MGMT 01/08/2017 [facility]  . MYRINGOPLASTY      Family history:  Family History  Problem Relation Age of Onset  . Eczema Mother   . Allergic rhinitis Mother   . Urticaria Neg Hx   . Immunodeficiency Neg Hx   . Angioedema Neg Hx   . Asthma Neg Hx   . Atopy Neg Hx     Social history: She lives in a home with carpeting with gas heating and central cooling. She does have exposure to a dog that lives with her daughter. There is no concern for water damage, mildew or roaches in the home. She  works as a Copywriter, advertising. She has no smoke or tobacco use.   Medication List: Allergies as of 03/13/2017      Reactions   Codeine Nausea Only   Sulfa Antibiotics Nausea Only   Latex Rash   Contact dermatitis      Medication List       Accurate as of 03/13/17 12:28 PM. Always use your most recent med list.          acetaminophen 500 MG tablet Commonly known as:  TYLENOL Take 1,000 mg by mouth daily as needed. For headach and pain   amLODipine 10 MG tablet Commonly known as:  NORVASC Take 10 mg by mouth every morning.   benazepril 20 MG tablet Commonly known as:  LOTENSIN Take 20 mg by mouth every morning.    cholecalciferol 1000 units tablet Commonly known as:  VITAMIN D Take 1,000 Units by mouth every evening.   cycloSPORINE 0.05 % ophthalmic emulsion Commonly known as:  RESTASIS Place 1 drop into both eyes 2 (two) times daily.   fish oil-omega-3 fatty acids 1000 MG capsule Take 2 g by mouth every evening.   hydrochlorothiazide 12.5 MG capsule Commonly known as:  MICROZIDE Take 12.5 mg by mouth daily.   ibuprofen 200 MG tablet Commonly known as:  ADVIL,MOTRIN Take 200 mg by mouth every 6 (six) hours as needed for pain.   levocetirizine 5 MG tablet Commonly known as:  XYZAL Take 5 mg by mouth.   TRIDERM 0.1 % Generic drug:  triamcinolone cream Apply 1 application topically 2 (two) times daily.       Known medication allergies: Allergies  Allergen Reactions  . Codeine Nausea Only  . Sulfa Antibiotics Nausea Only  . Latex Rash    Contact dermatitis     Physical examination: Blood pressure 120/74, pulse (!) 109, temperature 97.7 F (36.5 C), temperature source Oral, height 5\' 4"  (1.626 m), weight 238 lb 3.2 oz (108 kg), SpO2 95 %.  General: Alert, interactive, in no acute distress. HEENT: PERRLA, eyes tearing, TMs pearly gray, turbinates minimally edematous without discharge, post-pharynx non erythematous. Neck: Supple without lymphadenopathy. Lungs: Clear to auscultation without wheezing, rhonchi or rales. {no increased work of breathing. CV: Normal S1, S2 without murmurs. Abdomen: Nondistended, nontender. Skin: Scattered erythematous urticarial type lesions primarily located Bilateral arms, across abdomen, bilateral legs.  No rash on face and neck or upper back , nonvesicular. Extremities:  No clubbing, cyanosis or edema. Neuro:   Grossly intact.  Diagnositics/Labs: Labs:   Review of labs done by PCP from November 2017 shows a normal CBC with differential, normal CMP  Allergy testing:  Deferred due to ongoing urticaria   Assessment and plan:    chronic  urticaria    - At this time no identifiable triggers, likely idiopathic in nature.   She has no concerning features of her hives.   Improvement with antihistamines supports diagnosis of  urticaria.      - your labs done in Nov 2017 including CBC and CMP    - will obtain additional labs: chronic hive panel, tryptase level, environmental panel     - resume Xyzal use 1 tab daily.   If hives flare increase to Xyzal 2 tabs daily as well as add either Zantac 150mg  twice a day or Pepcid 20mg  twice a day     - consider patch testing in future once hives have resolved if concerned about detergents/fragrances as cause    allergic rhinoconjunctivitis, presumed    -  will obtain environmental panel as above    - resume Xyzal daily   atopic dermatitis     - continue daily moisturization     - continue Triderm as needed for eczema flares  Follow-up 4 months   No Follow-up on file.  I appreciate the opportunity to take part in [place]'s care. Please do not hesitate to contact me with questions.  Sincerely,   Prudy Feeler, MD Allergy/Immunology [facility]

## 2017-03-13 NOTE — Patient Instructions (Addendum)
Hives    - At this time no identifiable triggers.  No concerning features of your hives.   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      - your labs done in Nov 2017 including CBC and CMP (assess blood levels, liver and kidney function are normal).      - will obtain additional labs: chronic hive panel, tryptase level, environmental panel     - resume Xyzal use 1 tab daily.   If hives flare increase to Xyzal 2 tabs daily as well as add either Zantac 150mg  twice a day or Pepcid 20mg  twice a day     - consider patch testing in future once hives have resolved if concerned about detergents/fragrances as cause   Allergies    - will obtain environmental panel as above    - resume Xyzal  Eczema     - continue daily moisturization     - continue Triderm as needed for eczema flares  Follow-up 4 months

## 2017-03-16 LAB — CP584 ZONE 3
Allergen, A. alternata, m6: <0.1 kU/L
Allergen, Black Locust, Acacia9: <0.1 kU/L
Allergen, Cedar tree, t12: 0.18 kU/L — ABNORMAL HIGH
Allergen, Comm Silver Birch, t9: <0.1 kU/L
Allergen, D pternoyssinus,d7: 0.29 kU/L — ABNORMAL HIGH
Allergen, Mucor Racemosus, M4: <0.1 kU/L
Allergen, P. notatum, m1: 0.97 kU/L — ABNORMAL HIGH
COMMON RAGWEED: 0.12 kU/L — AB
Cat Dander: 0.14 kU/L — ABNORMAL HIGH
D. farinae: 1.03 kU/L — ABNORMAL HIGH
DOG DANDER: 0.92 kU/L — AB
Elm IgE: 0.1 kU/L — ABNORMAL HIGH
Meadow Grass: <0.1 kU/L
Plantain: <0.1 kU/L

## 2017-03-17 LAB — TRYPTASE: Tryptase: 5.2 ug/L (ref <11)

## 2017-03-18 LAB — CP CHRONIC URTICARIA INDEX PANEL
Histamine Release: <16 % (ref <16)
THYROID PEROXIDASE ANTIBODY: 1 [IU]/mL (ref <9)
TSH: 1.33 m[IU]/L
Thyroglobulin Ab: <1 IU/mL (ref <2)

## 2017-03-19 ENCOUNTER — Telehealth: Payer: Self-pay | Admitting: Allergy

## 2017-03-19 NOTE — Telephone Encounter (Signed)
Called patient and we discussed labs results. I informed her that I mailed her the avoidance measures.

## 2017-03-19 NOTE — Telephone Encounter (Signed)
Pt was returning your call about her labs [phone].

## 2017-05-22 ENCOUNTER — Telehealth: Payer: Self-pay | Admitting: *Deleted

## 2017-05-22 NOTE — Telephone Encounter (Signed)
Patient would like a return call regarding her bill. Patient is wondering why her bill is so high. She did not receive scratch testing. Only the consult with the doctor.

## 2017-05-26 NOTE — Telephone Encounter (Signed)
Left message that we only charged for consult & that it went to her deductible - kt

## 2017-07-17 ENCOUNTER — Ambulatory Visit: Payer: BLUE CROSS/BLUE SHIELD | Admitting: Allergy

## 2018-05-10 ENCOUNTER — Telehealth (HOSPITAL_COMMUNITY): Payer: Self-pay

## 2018-05-10 NOTE — Telephone Encounter (Signed)
Called to schedule f/u mri/mra. No answer, left vm. AW

## 2018-05-12 ENCOUNTER — Other Ambulatory Visit (HOSPITAL_COMMUNITY): Payer: Self-pay | Admitting: Interventional Radiology

## 2018-05-12 DIAGNOSIS — I671 Cerebral aneurysm, nonruptured: Secondary | ICD-10-CM

## 2018-05-28 DIAGNOSIS — I10 Essential (primary) hypertension: Secondary | ICD-10-CM | POA: Diagnosis not present

## 2018-05-28 DIAGNOSIS — E782 Mixed hyperlipidemia: Secondary | ICD-10-CM | POA: Diagnosis not present

## 2018-05-28 DIAGNOSIS — E559 Vitamin D deficiency, unspecified: Secondary | ICD-10-CM | POA: Diagnosis not present

## 2018-05-28 DIAGNOSIS — R7301 Impaired fasting glucose: Secondary | ICD-10-CM | POA: Diagnosis not present

## 2018-06-04 DIAGNOSIS — Z1231 Encounter for screening mammogram for malignant neoplasm of breast: Secondary | ICD-10-CM | POA: Diagnosis not present

## 2018-06-11 ENCOUNTER — Ambulatory Visit (HOSPITAL_COMMUNITY): Payer: Medicare HMO

## 2018-06-11 ENCOUNTER — Encounter (HOSPITAL_COMMUNITY): Payer: Self-pay

## 2018-06-11 ENCOUNTER — Ambulatory Visit (HOSPITAL_COMMUNITY)
Admission: RE | Admit: 2018-06-11 | Discharge: 2018-06-11 | Disposition: A | Discharge status: Home / Self Care | Payer: Medicare HMO | Source: Ambulatory Visit | Attending: Interventional Radiology | Admitting: Interventional Radiology

## 2018-06-11 DIAGNOSIS — H0589 Other disorders of orbit: Secondary | ICD-10-CM | POA: Insufficient documentation

## 2018-06-11 DIAGNOSIS — I671 Cerebral aneurysm, nonruptured: Secondary | ICD-10-CM | POA: Diagnosis present

## 2018-06-11 LAB — CREATININE, SERUM
Creatinine, Ser: 0.92 mg/dL (ref 0.44–1.00)
GFR calc Af Amer: >60 mL/min (ref >60)

## 2018-06-11 MED ORDER — GADOBENATE DIMEGLUMINE 529 MG/ML IV SOLN
20.0000 mL | Freq: Once | INTRAVENOUS | Status: AC | PRN
  Administered 2018-06-11: 20 mL via INTRAVENOUS

## 2018-07-30 DIAGNOSIS — H52203 Unspecified astigmatism, bilateral: Secondary | ICD-10-CM | POA: Diagnosis not present

## 2018-07-30 DIAGNOSIS — H2513 Age-related nuclear cataract, bilateral: Secondary | ICD-10-CM | POA: Diagnosis not present

## 2018-07-30 DIAGNOSIS — H1851 Endothelial corneal dystrophy: Secondary | ICD-10-CM | POA: Diagnosis not present

## 2018-07-30 DIAGNOSIS — H5203 Hypermetropia, bilateral: Secondary | ICD-10-CM | POA: Diagnosis not present

## 2018-08-20 DIAGNOSIS — H5340 Unspecified visual field defects: Secondary | ICD-10-CM | POA: Diagnosis not present

## 2018-08-20 DIAGNOSIS — D3162 Benign neoplasm of unspecified site of left orbit: Secondary | ICD-10-CM | POA: Diagnosis not present

## 2018-09-24 DIAGNOSIS — R69 Illness, unspecified: Secondary | ICD-10-CM | POA: Diagnosis not present

## 2018-09-30 DIAGNOSIS — R69 Illness, unspecified: Secondary | ICD-10-CM | POA: Diagnosis not present

## 2018-10-15 DIAGNOSIS — D3162 Benign neoplasm of unspecified site of left orbit: Secondary | ICD-10-CM | POA: Diagnosis not present

## 2018-10-15 DIAGNOSIS — H04123 Dry eye syndrome of bilateral lacrimal glands: Secondary | ICD-10-CM | POA: Diagnosis not present

## 2018-10-22 DIAGNOSIS — K644 Residual hemorrhoidal skin tags: Secondary | ICD-10-CM | POA: Diagnosis not present

## 2018-10-22 DIAGNOSIS — K648 Other hemorrhoids: Secondary | ICD-10-CM | POA: Diagnosis not present

## 2018-10-22 DIAGNOSIS — Z86010 Personal history of colonic polyps: Secondary | ICD-10-CM | POA: Diagnosis not present

## 2018-10-22 DIAGNOSIS — K5730 Diverticulosis of large intestine without perforation or abscess without bleeding: Secondary | ICD-10-CM | POA: Diagnosis not present

## 2018-11-03 DIAGNOSIS — H0289 Other specified disorders of eyelid: Secondary | ICD-10-CM | POA: Diagnosis not present

## 2018-11-03 DIAGNOSIS — D3162 Benign neoplasm of unspecified site of left orbit: Secondary | ICD-10-CM | POA: Diagnosis not present

## 2018-11-11 DIAGNOSIS — R69 Illness, unspecified: Secondary | ICD-10-CM | POA: Diagnosis not present

## 2018-11-24 DIAGNOSIS — R69 Illness, unspecified: Secondary | ICD-10-CM | POA: Diagnosis not present

## 2018-12-15 DIAGNOSIS — R69 Illness, unspecified: Secondary | ICD-10-CM | POA: Diagnosis not present

## 2018-12-28 DIAGNOSIS — R69 Illness, unspecified: Secondary | ICD-10-CM | POA: Diagnosis not present

## 2019-02-20 IMAGING — MR MR MRA HEAD W/O CM
12 of 15 series · 28 of 48 positions shown · IV contrast (Multihance)
Comparison: 12/19/2016 and earlier.

CLINICAL DATA: 67-year-old female status post endovascular
treatment, with staged embolization, of posterior fossa AV fistula.
Subsequent encounter.

EXAM:
MRI HEAD WITHOUT AND WITH CONTRAST
MRA HEAD WITHOUT CONTRAST
TECHNIQUE: Multiplanar, multiecho pulse sequences of the brain and surrounding
structures were obtained without and with intravenous contrast.
Angiographic images of the head were obtained using MRA technique
without contrast.
CONTRAST:  20mL MULTIHANCE GADOBENATE DIMEGLUMINE 529 MG/ML IV SOLN

[Series 5: ax dwi_tracew · axial · 3.0mm · 1.50mm/px · z∈[-41,+104]mm · 5 of 80 slices shown]
[im 1/80]
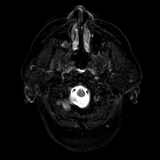
[im 20/80]
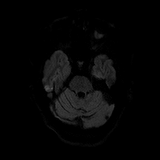
[im 40/80]
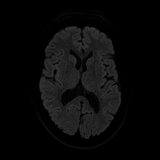
[im 60/80]
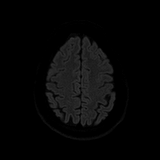
[im 80/80]
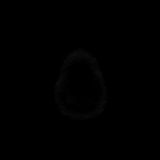

[Series 6: ax dwi_adc · axial · 3.0mm · 1.50mm/px · z∈[-41,+104]mm · 2 of 40 slices shown]
[im 1/40]
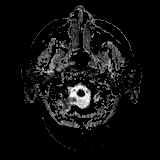
[im 40/40]
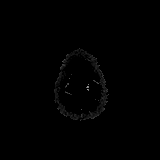

[Series 7: cor dwi_tracew · coronal · 5.0mm · 1.44mm/px · 4 of 64 slices shown]
[im 1/64]
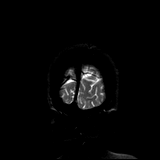
[im 22/64]
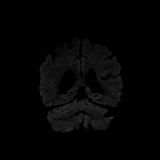
[im 43/64]
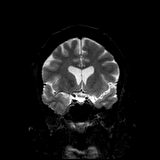
[im 64/64]
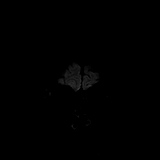

[Series 8: cor dwi_adc · coronal · 5.0mm · 1.44mm/px · 2 of 32 slices shown]
[im 1/32]
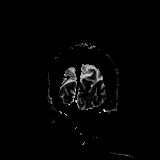
[im 32/32]
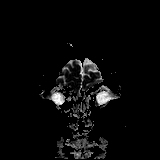

[Series 13: T1 · sagittal · 5.0mm · 0.75mm/px · 1 of 23 slices shown]
[im 1/23]
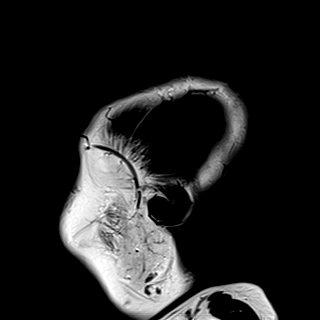

[Series 14: T2 · axial · 5.0mm · 0.69mm/px · 1 of 25 slices shown]
[im 1/25]
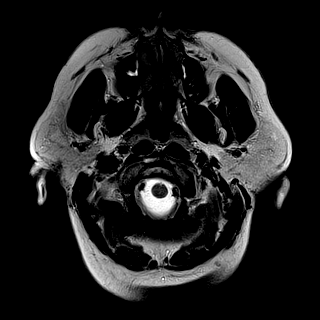

[Series 15: FLAIR · axial · 5.0mm · 0.43mm/px · 1 of 25 slices shown]
[im 1/25]
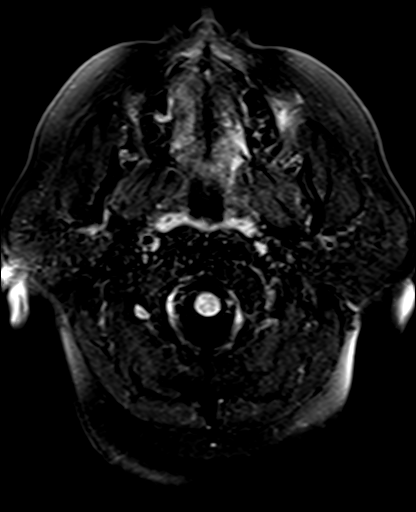

[Series 16: swi_images · axial · 3.0mm · 0.86mm/px · z∈[-72,+104]mm · 4 of 60 slices shown]
[im 1/60]
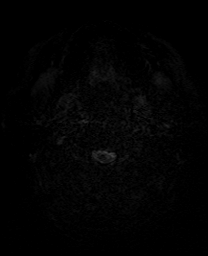
[im 20/60]
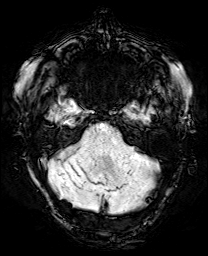
[im 40/60]
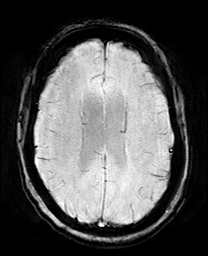
[im 60/60]
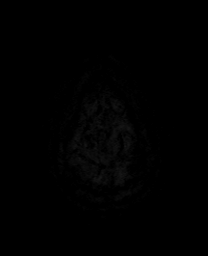

[Series 17: mip_images(sw) · axial · 24.0mm · 0.86mm/px · z∈[-62,+93]mm · 3 of 53 slices shown]
[im 1/53]
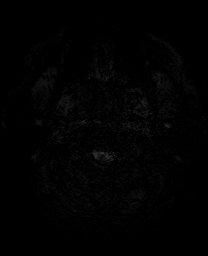
[im 27/53]
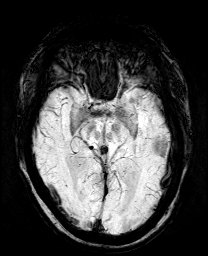
[im 53/53]
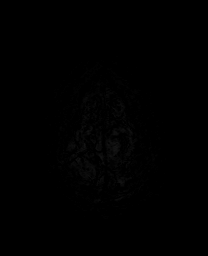

[Series 19: T2 post-contrast · coronal · 5.0mm · 0.72mm/px · 2 of 28 slices shown]
[im 1/28]
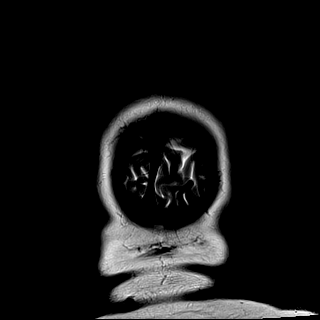
[im 28/28]
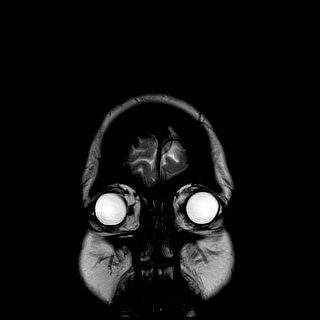

[Series 21: T1 post-contrast · coronal · 5.0mm · 0.34mm/px · 2 of 28 slices shown (1 of 2)]
[im 1/28]
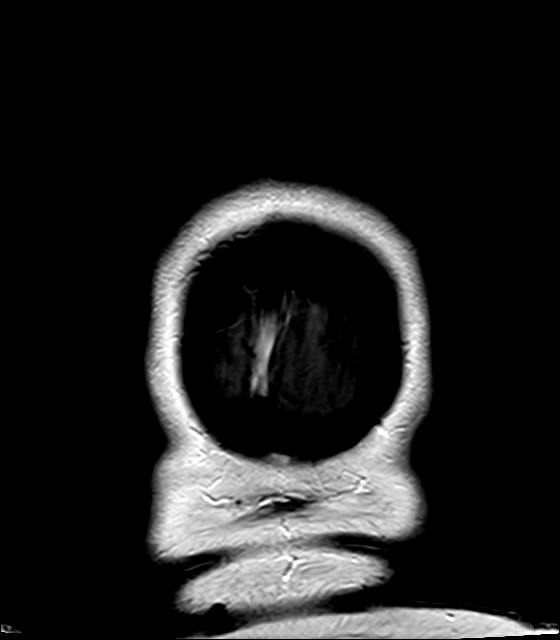
[im 28/28]
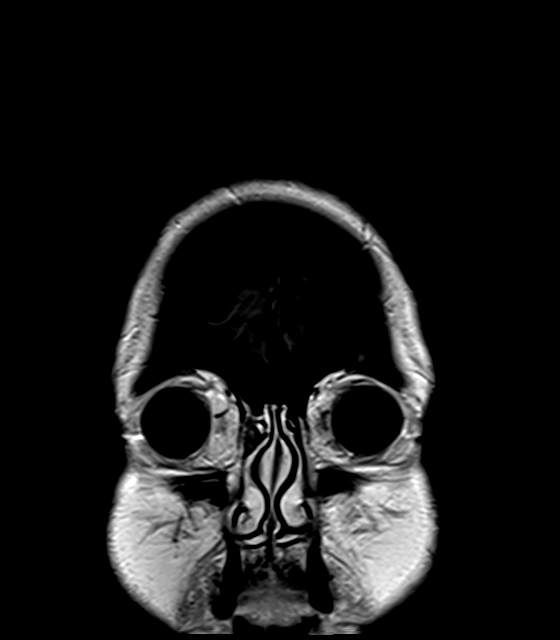

[Series 22: T1 post-contrast · sagittal · 5.0mm · 0.72mm/px · 1 of 23 slices shown (2 of 2)]
[im 1/23]
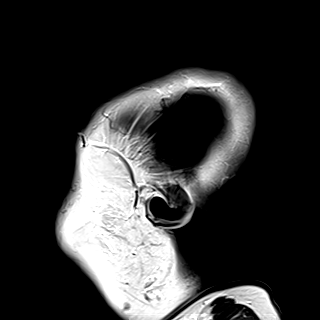

[28 of 48 positions shown; findings below may reference images not displayed]

FINDINGS: MRI HEAD FINDINGS

Brain: No restricted diffusion to suggest acute infarction. No
midline shift, mass effect, evidence of mass lesion,
ventriculomegaly, extra-axial collection or acute intracranial
hemorrhage. Cervicomedullary junction and pituitary are within
normal limits.

Patchy chronic left cerebellar encephalomalacia is stable since
5512. Chronic cerebellar blood products are better demonstrated by
susceptibility weighted imaging today versus T2* imaging on prior
studies but likely unchanged. No posterior fossa edema or mass
effect. Following contrast there is stable mild increased petechial
and leptomeningeal enhancement along the cerebellar folia which as
before appears to primarily drain into a asymmetrically enlarged
right superior cerebellar vein (series 20, image 22). No
pachymeningeal thickening. No increased enhancement.

Stable and normal for age supratentorial gray and white matter
signal. No other abnormal intracranial enhancement.

Vascular: Major intracranial vascular flow voids remain stable.

Skull and upper cervical spine: Negative visible cervical spine and
spinal cord. Stable bone marrow signal.

Sinuses/Orbits: As described in 8768, there is a chronic oval
circumscribed enhancing left orbit intraconal mass immediately
behind the left globe which encompasses 8 x 15 x 10 millimeters (AP
by transverse by CC). This was subtle, 6 x 7 x 5 millimeters in 0811
but the enhancement pattern is unchanged since that time. No
associated intraorbital fat stranding. The other bilateral orbits
soft tissues remain normal.

Paranasal Visualized paranasal sinuses and mastoids are stable and
well pneumatized.

Other: Visible internal auditory structures appear normal. Scalp and
face soft tissues appears stable and negative.

MRA HEAD FINDINGS

Stable antegrade flow in the posterior circulation with fairly
codominant distal vertebral arteries. No distal vertebral stenosis.
Normal PICA and dominant right AICA origins. Normal vertebrobasilar
junction and basilar artery. Normal SCA and PCA origins. Posterior
communicating arteries are diminutive or absent. The bilateral PCA
branches are stable and within normal limits.

Cephalad direction flow signal in the dominant right superior
cerebellar draining vein is similar to the 0811 MRA. No other
asymmetric or abnormal flow signal is identified in the posterior
fossa.

Antegrade flow in both ICA siphons. Mild siphon ectasia on the left.
Mild siphon irregularity. No stenosis. Normal ophthalmic artery
origins. Carotid termini, MCA and ACA origins are stable and within
normal limits. Anterior communicating artery, visible bilateral ACA,
and bilateral MCA branches are stable and within normal limits. No
MRA flow signal within the left orbital mass.
IMPRESSION: 1. Stable MRI and MRA appearance of the posterior fossa since 0811.
Residual dominant right superior cerebellar draining vein.
2. No new intracranial abnormality.
3. Slow growing left orbital mass most compatible with left orbital
cavernous venous malformation (AKA cavernous hemangioma). As noted
in 8768, has enlarged from approximately 8 mm to now 15 mm since
0811. Recommend continued Ophthalmology follow-up.

## 2019-03-04 ENCOUNTER — Other Ambulatory Visit (HOSPITAL_COMMUNITY)
Admission: RE | Admit: 2019-03-04 | Discharge: 2019-03-04 | Disposition: A | Discharge status: Home / Self Care | Payer: Medicare HMO | Source: Ambulatory Visit | Attending: Family Medicine | Admitting: Family Medicine

## 2019-03-04 ENCOUNTER — Other Ambulatory Visit: Payer: Self-pay | Admitting: Family Medicine

## 2019-03-04 DIAGNOSIS — I10 Essential (primary) hypertension: Secondary | ICD-10-CM | POA: Diagnosis not present

## 2019-03-04 DIAGNOSIS — Z23 Encounter for immunization: Secondary | ICD-10-CM | POA: Diagnosis not present

## 2019-03-04 DIAGNOSIS — R7301 Impaired fasting glucose: Secondary | ICD-10-CM | POA: Diagnosis not present

## 2019-03-04 DIAGNOSIS — E559 Vitamin D deficiency, unspecified: Secondary | ICD-10-CM | POA: Diagnosis not present

## 2019-03-04 DIAGNOSIS — Z124 Encounter for screening for malignant neoplasm of cervix: Secondary | ICD-10-CM | POA: Diagnosis not present

## 2019-03-04 DIAGNOSIS — H0259 Other disorders affecting eyelid function: Secondary | ICD-10-CM | POA: Diagnosis not present

## 2019-03-04 DIAGNOSIS — N649 Disorder of breast, unspecified: Secondary | ICD-10-CM | POA: Diagnosis not present

## 2019-03-04 DIAGNOSIS — Z0000 Encounter for general adult medical examination without abnormal findings: Secondary | ICD-10-CM | POA: Diagnosis not present

## 2019-03-04 DIAGNOSIS — Z86010 Personal history of colonic polyps: Secondary | ICD-10-CM | POA: Diagnosis not present

## 2019-03-04 DIAGNOSIS — E782 Mixed hyperlipidemia: Secondary | ICD-10-CM | POA: Diagnosis not present

## 2019-03-08 LAB — CYTOLOGY - PAP: DIAGNOSIS: NEGATIVE

## 2019-03-11 DIAGNOSIS — D225 Melanocytic nevi of trunk: Secondary | ICD-10-CM | POA: Diagnosis not present

## 2019-03-11 DIAGNOSIS — L821 Other seborrheic keratosis: Secondary | ICD-10-CM | POA: Diagnosis not present

## 2019-03-11 DIAGNOSIS — L728 Other follicular cysts of the skin and subcutaneous tissue: Secondary | ICD-10-CM | POA: Diagnosis not present

## 2019-03-11 DIAGNOSIS — B078 Other viral warts: Secondary | ICD-10-CM | POA: Diagnosis not present

## 2019-06-14 DIAGNOSIS — R69 Illness, unspecified: Secondary | ICD-10-CM | POA: Diagnosis not present

## 2019-06-17 DIAGNOSIS — Z1231 Encounter for screening mammogram for malignant neoplasm of breast: Secondary | ICD-10-CM | POA: Diagnosis not present

## 2019-07-05 DIAGNOSIS — R69 Illness, unspecified: Secondary | ICD-10-CM | POA: Diagnosis not present

## 2019-08-05 DIAGNOSIS — I10 Essential (primary) hypertension: Secondary | ICD-10-CM | POA: Diagnosis not present

## 2019-08-05 DIAGNOSIS — E782 Mixed hyperlipidemia: Secondary | ICD-10-CM | POA: Diagnosis not present

## 2019-08-11 DIAGNOSIS — R69 Illness, unspecified: Secondary | ICD-10-CM | POA: Diagnosis not present

## 2019-08-17 DIAGNOSIS — L308 Other specified dermatitis: Secondary | ICD-10-CM | POA: Diagnosis not present

## 2019-08-17 DIAGNOSIS — L301 Dyshidrosis [pompholyx]: Secondary | ICD-10-CM | POA: Diagnosis not present

## 2019-09-01 DIAGNOSIS — R69 Illness, unspecified: Secondary | ICD-10-CM | POA: Diagnosis not present

## 2019-09-12 DIAGNOSIS — R69 Illness, unspecified: Secondary | ICD-10-CM | POA: Diagnosis not present

## 2019-09-13 DIAGNOSIS — R69 Illness, unspecified: Secondary | ICD-10-CM | POA: Diagnosis not present

## 2019-10-17 DIAGNOSIS — H5203 Hypermetropia, bilateral: Secondary | ICD-10-CM | POA: Diagnosis not present

## 2019-10-17 DIAGNOSIS — H52203 Unspecified astigmatism, bilateral: Secondary | ICD-10-CM | POA: Diagnosis not present

## 2019-10-17 DIAGNOSIS — H2513 Age-related nuclear cataract, bilateral: Secondary | ICD-10-CM | POA: Diagnosis not present

## 2019-11-09 DIAGNOSIS — H11422 Conjunctival edema, left eye: Secondary | ICD-10-CM | POA: Diagnosis not present

## 2019-11-09 DIAGNOSIS — H0289 Other specified disorders of eyelid: Secondary | ICD-10-CM | POA: Diagnosis not present

## 2019-11-09 DIAGNOSIS — D481 Neoplasm of uncertain behavior of connective and other soft tissue: Secondary | ICD-10-CM | POA: Diagnosis not present

## 2019-11-10 ENCOUNTER — Telehealth (HOSPITAL_COMMUNITY): Payer: Self-pay

## 2019-11-10 NOTE — Telephone Encounter (Signed)
Pt called to find out when her f/u was due. I have sent a request. Will call to schedule once auth comes back. AW

## 2019-11-17 ENCOUNTER — Other Ambulatory Visit (HOSPITAL_COMMUNITY): Payer: Self-pay | Admitting: Interventional Radiology

## 2019-11-17 DIAGNOSIS — I671 Cerebral aneurysm, nonruptured: Secondary | ICD-10-CM

## 2019-12-14 ENCOUNTER — Ambulatory Visit (HOSPITAL_COMMUNITY): Payer: Medicare HMO

## 2019-12-14 ENCOUNTER — Other Ambulatory Visit: Payer: Self-pay

## 2019-12-14 ENCOUNTER — Ambulatory Visit (HOSPITAL_COMMUNITY)
Admission: RE | Admit: 2019-12-14 | Discharge: 2019-12-14 | Disposition: A | Discharge status: Home / Self Care | Payer: Medicare HMO | Source: Ambulatory Visit | Attending: Interventional Radiology | Admitting: Interventional Radiology

## 2019-12-14 DIAGNOSIS — I671 Cerebral aneurysm, nonruptured: Secondary | ICD-10-CM | POA: Diagnosis not present

## 2019-12-14 DIAGNOSIS — H05812 Cyst of left orbit: Secondary | ICD-10-CM | POA: Diagnosis not present

## 2019-12-14 LAB — POCT I-STAT CREATININE: Creatinine, Ser: 0.8 mg/dL (ref 0.44–1.00)

## 2019-12-14 MED ORDER — GADOBUTROL 1 MMOL/ML IV SOLN
10.0000 mL | Freq: Once | INTRAVENOUS | Status: AC | PRN
  Administered 2019-12-14: 12:00:00 10 mL via INTRAVENOUS

## 2019-12-16 ENCOUNTER — Other Ambulatory Visit (HOSPITAL_COMMUNITY): Payer: Self-pay | Admitting: Interventional Radiology

## 2019-12-16 DIAGNOSIS — I671 Cerebral aneurysm, nonruptured: Secondary | ICD-10-CM

## 2019-12-19 DIAGNOSIS — E782 Mixed hyperlipidemia: Secondary | ICD-10-CM | POA: Diagnosis not present

## 2019-12-19 DIAGNOSIS — I10 Essential (primary) hypertension: Secondary | ICD-10-CM | POA: Diagnosis not present

## 2019-12-20 ENCOUNTER — Ambulatory Visit (HOSPITAL_COMMUNITY): Payer: Medicare HMO

## 2019-12-21 ENCOUNTER — Telehealth (HOSPITAL_COMMUNITY): Payer: Self-pay

## 2019-12-21 NOTE — Telephone Encounter (Signed)
Called pt to get ophthalmologist name to send a copy of recent MRI. No answer, left vm. AW

## 2019-12-26 ENCOUNTER — Telehealth (HOSPITAL_COMMUNITY): Payer: Self-pay

## 2019-12-26 NOTE — Telephone Encounter (Signed)
Pt called with ophthalmology mailing info.  Dr. [facility] [address], [place] [id number]

## 2020-01-03 ENCOUNTER — Ambulatory Visit (HOSPITAL_COMMUNITY)
Admission: RE | Admit: 2020-01-03 | Discharge: 2020-01-03 | Disposition: A | Discharge status: Home / Self Care | Payer: Medicare HMO | Source: Ambulatory Visit | Attending: Interventional Radiology | Admitting: Interventional Radiology

## 2020-01-03 ENCOUNTER — Other Ambulatory Visit: Payer: Self-pay

## 2020-01-03 DIAGNOSIS — I671 Cerebral aneurysm, nonruptured: Secondary | ICD-10-CM

## 2020-01-03 NOTE — Progress Notes (Signed)
Chief Complaint: Patient was seen in consultation today for DAVF s/p staged embolization.  Referring Physician(s): Roderick Pee  Supervising Physician: Luanne Bras  Patient Status: [facility] - Out-pt  History of Present Illness: Katie King is a 69 y.o. female with a past medical history as below, with pertinent past medical history including hypertension and DAVF s/p staged embolization. She is known to [facility] and has been followed by Dr. Estanislado Pandy since 12/2010. She first presented to our department at the request of Dr. Janace Hoard for management of a DAVF. She has undergone multiple staged embolizations with [place], as below:  01/15/2011- Right DAVF s/p staged embolization of right external carotid branches using Onyx by Dr. Estanislado Pandy. 04/23/2011- DAVF s/p staged embolization of left occipital and left middle meningeal branches using Onyx 34 and Onyx 18 by Dr. Estanislado Pandy. 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 Estanislado Pandy.  She has since been followed by routine imaging scans and diagnostic cerebral arteriograms to monitor for changes.  MRI/MRA brain/head 12/14/2019: 1. No substantial change since 06/11/2018. No evidence of interval hemorrhage or increased arteriovenous flow. 2. Stable left orbital intraconal mass. The T2 signal of this lesion argues against a cavernous venous malformation, which is typically hyperintense. 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  Patient presents today for follow-up to discuss findings of recent MRI/MRA 12/14/2019. Patient awake and alert sitting in chair with no complaints at this time. Denies headache, weakness, numbness/tingling, dizziness, vision changes, hearing changes, tinnitus, or speech difficulty.   Past Medical History:  Diagnosis Date  . Hypertension     Past Surgical History:  Procedure Laterality Date  . CEREBRAL EMBOLIZATION     multiple  emobolization with onyx  . DILATION AND CURETTAGE OF UTERUS  1984  . dural av fistula     embolization  . HERNIA REPAIR     at age 51  . IR GENERIC HISTORICAL  01/08/2017   IR RADIOLOGIST EVAL & MGMT 01/08/2017 [facility]-INTERV RAD  . MYRINGOPLASTY      Allergies: Codeine, Sulfa antibiotics, and Latex  Medications: Prior to Admission medications   Medication Sig Start Date End Date Taking? Authorizing Provider  acetaminophen (TYLENOL) 500 MG tablet Take 1,000 mg by mouth daily as needed. For headach and pain    [provider]  amLODipine (NORVASC) 10 MG tablet Take 10 mg by mouth every morning.     [provider]  benazepril (LOTENSIN) 20 MG tablet Take 20 mg by mouth every morning.     [provider]  cholecalciferol (VITAMIN D) 1000 UNITS tablet Take 1,000 Units by mouth every evening.     [provider]  cycloSPORINE (RESTASIS) 0.05 % ophthalmic emulsion Place 1 drop into both eyes 2 (two) times daily.    [provider]  fish oil-omega-3 fatty acids 1000 MG capsule Take 2 g by mouth every evening.     [provider]  hydrochlorothiazide (MICROZIDE) 12.5 MG capsule Take 12.5 mg by mouth daily.    [provider]  ibuprofen (ADVIL,MOTRIN) 200 MG tablet Take 200 mg by mouth every 6 (six) hours as needed for pain.    [provider]  levocetirizine (XYZAL) 5 MG tablet Take 5 mg by mouth.    [provider]  triamcinolone cream (TRIDERM) 0.1 % Apply 1 application topically 2 (two) times daily.    [provider]     Family History  Problem  Relation Age of Onset  . Eczema Mother   . Allergic rhinitis Mother   . Urticaria Neg Hx   . Immunodeficiency Neg Hx   . Angioedema Neg Hx   . Asthma Neg Hx   . Atopy Neg Hx     Social History   Socioeconomic History  . Marital status: Widowed    Spouse name: Not on file  . Number of children: Not on file  . Years of education: Not on file  . Highest  education level: Not on file  Occupational History  . Not on file  Tobacco Use  . Smoking status: Never Smoker  . Smokeless tobacco: Never Used  Substance and Sexual Activity  . Alcohol use: Yes    Alcohol/week: 1.0 standard drinks    Types: 1 Cans of beer per week  . Drug use: No  . Sexual activity: Not on file  Other Topics Concern  . Not on file  Social History Narrative  . Not on file   Social Determinants of Health   Financial Resource Strain:   . Difficulty of Paying Living Expenses: Not on file  Food Insecurity:   . Worried About Charity fundraiser in the Last Year: Not on file  . Ran Out of Food in the Last Year: Not on file  Transportation Needs:   . Lack of Transportation (Medical): Not on file  . Lack of Transportation (Non-Medical): Not on file  Physical Activity:   . Days of Exercise per Week: Not on file  . Minutes of Exercise per Session: Not on file  Stress:   . Feeling of Stress : Not on file  Social Connections:   . Frequency of Communication with Friends and Family: Not on file  . Frequency of Social Gatherings with Friends and Family: Not on file  . Attends Religious Services: Not on file  . Active Member of Clubs or Organizations: Not on file  . Attends Archivist Meetings: Not on file  . Marital Status: Not on file     Review of Systems: A 12 point ROS discussed and pertinent positives are indicated in the HPI above.  All other systems are negative.  Review of Systems  Constitutional: Negative for chills and fever.  HENT: Negative for hearing loss and tinnitus.   Eyes: Negative for visual disturbance.  Respiratory: Negative for shortness of breath and wheezing.   Cardiovascular: Negative for chest pain and palpitations.  Neurological: Negative for dizziness, speech difficulty, weakness, numbness and headaches.  Psychiatric/Behavioral: Negative for behavioral problems and confusion.    Physical Exam Constitutional:      General:  She is not in acute distress.    Appearance: Normal appearance.  Pulmonary:     Effort: Pulmonary effort is normal. No respiratory distress.  Skin:    General: Skin is warm and dry.  Neurological:     Mental Status: She is alert and oriented to person, place, and time.  Psychiatric:        Mood and Affect: Mood normal.        Behavior: Behavior normal.      Imaging: MR ANGIO HEAD WO CONTRAST  Result Date: 12/14/2019 CLINICAL DATA:  Post endovascular treatment of posterior fossa AV fistula, follow-up EXAM: MRI HEAD WITHOUT AND WITH CONTRAST MRA HEAD WITHOUT CONTRAST TECHNIQUE: Multiplanar, multiecho pulse sequences of the brain and surrounding structures were obtained without and with intravenous contrast. Angiographic images of the head were obtained using MRA technique without contrast. CONTRAST:  80mL GADAVIST GADOBUTROL 1 MMOL/ML IV SOLN COMPARISON:  06/11/2018 FINDINGS: MRI HEAD FINDINGS Brain: Left cerebellar encephalomalacia again identified. Increased susceptibility in the posterior fossa likely reflects chronic blood products and veins. There is no evidence of new hemorrhage. As before, there is curvilinear enhancement in both cerebellar hemispheres coalescing within the folia with greatest drainage into an asymmetrically enlarged right superior cerebellar vein. There is no acute infarction. No intracranial mass or mass effect. No new abnormal enhancement. Vascular: Major vessel flow voids at the skull base are preserved. Skull and upper cervical spine: Normal marrow signal is preserved. Sinuses/Orbits: Paranasal sinuses are aerated. Stable enhancing T2 isointense lesion of the intraconal left orbit abutting the posterior aspect of the globe as well as the undersurface of the optic nerve. Other: Sella is unremarkable.  Mastoid air cells are clear. MRA HEAD FINDINGS Intracranial internal carotid arteries are patent. Middle and anterior cerebral arteries are patent. Intracranial vertebral  arteries, basilar artery, posterior cerebral arteries are patent. Bilateral posterior communicating arteries are present. There is no significant stenosis or aneurysm. Flow related enhancement is again present within the aforementioned right superior cerebellar draining vein. There is no new abnormal vascularity. There is no apparent increase in caliber of any cerebellar arteries. IMPRESSION: No substantial change since 06/11/2018. No evidence of interval hemorrhage or increased arteriovenous flow. Stable left orbital intraconal mass. The T2 signal of this lesion argues against a cavernous venous malformation, which is typically hyperintense. 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 Electronically Signed   By: Macy Mis M.D.   On: 12/14/2019 16:00   MR BRAIN W WO CONTRAST  Result Date: 12/14/2019 CLINICAL DATA:  Post endovascular treatment of posterior fossa AV fistula, follow-up EXAM: MRI HEAD WITHOUT AND WITH CONTRAST MRA HEAD WITHOUT CONTRAST TECHNIQUE: Multiplanar, multiecho pulse sequences of the brain and surrounding structures were obtained without and with intravenous contrast. Angiographic images of the head were obtained using MRA technique without contrast. CONTRAST:  81mL GADAVIST GADOBUTROL 1 MMOL/ML IV SOLN COMPARISON:  06/11/2018 FINDINGS: MRI HEAD FINDINGS Brain: Left cerebellar encephalomalacia again identified. Increased susceptibility in the posterior fossa likely reflects chronic blood products and veins. There is no evidence of new hemorrhage. As before, there is curvilinear enhancement in both cerebellar hemispheres coalescing within the folia with greatest drainage into an asymmetrically enlarged right superior cerebellar vein. There is no acute infarction. No intracranial mass or mass effect. No new abnormal enhancement. Vascular: Major vessel flow voids at the skull base are preserved. Skull and upper cervical spine: Normal marrow signal is preserved.  Sinuses/Orbits: Paranasal sinuses are aerated. Stable enhancing T2 isointense lesion of the intraconal left orbit abutting the posterior aspect of the globe as well as the undersurface of the optic nerve. Other: Sella is unremarkable.  Mastoid air cells are clear. MRA HEAD FINDINGS Intracranial internal carotid arteries are patent. Middle and anterior cerebral arteries are patent. Intracranial vertebral arteries, basilar artery, posterior cerebral arteries are patent. Bilateral posterior communicating arteries are present. There is no significant stenosis or aneurysm. Flow related enhancement is again present within the aforementioned right superior cerebellar draining vein. There is no new abnormal vascularity. There is no apparent increase in caliber of any cerebellar arteries. IMPRESSION: No substantial change since 06/11/2018. No evidence of interval hemorrhage or increased arteriovenous flow. Stable left orbital intraconal mass. The T2 signal of this lesion argues against a cavernous venous malformation, which is typically hyperintense. 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 Electronically Signed  By: Macy Mis M.D.   On: 12/14/2019 16:00    Labs:  BMP: Recent Labs    12/14/19 1022  CREATININE 0.80    Assessment and Plan:  DAVF s/p staged embolization (of right external carotid branches using Onyx 01/15/2011 by Dr. Estanislado Pandy; of left occipital and left middle meningeal branches using Onyx 34 and Onyx 18 04/23/2011 by Dr. 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 Estanislado Pandy). Dr. Estanislado Pandy was present for consultation. Discussed symptoms. Patient is grossly asymptomatic from a neurology standpoint at this time. Discussed DAVF s/p staged embolization, stable on recent MRI/MRA. Since patient is grossly asymptomatic at this time, recommend continued conservative management including routine imaging scans to monitor for  changes.  Discussed known left orbital intraconal mass. Patient states that she sees 2 ophthalmologists for this. Instructed patient to follow-up with her ophthalmologists regularly regarding management.  Plan for follow-up with MRI/MRV brain/head (without contrast) 1 year from prior. Informed patient that our schedulers will call her to set up these imaging scans.  All questions answered and concerns addressed. Patient conveys understanding and agrees with plan.  Thank you for this interesting consult.  I greatly enjoyed meeting Katie King and look forward to participating in their care.  A copy of this report was sent to the requesting provider on this date.  Electronically Signed: Earley Abide, PA-C 01/03/2020, 8:10 AM   I spent a total of 40 Minutes in face to face in clinical consultation, greater than 50% of which was counseling/coordinating care for DAVF s/p staged embolization.

## 2020-01-20 ENCOUNTER — Ambulatory Visit: Payer: Medicare HMO | Attending: Internal Medicine

## 2020-01-20 DIAGNOSIS — Z23 Encounter for immunization: Secondary | ICD-10-CM | POA: Insufficient documentation

## 2020-01-20 NOTE — Progress Notes (Signed)
   Covid-19 Vaccination Clinic  Name:  Katie King    MRN: [id number] DOB: August 15, 1951  01/20/2020  [name] was observed post Covid-19 immunization for 15 minutes without incidence. She was provided with Vaccine Information Sheet and instruction to access the V-Safe system.   Ms. Tape was instructed to call 911 with any severe reactions post vaccine: Marland Kitchen Difficulty breathing  . Swelling of your face and throat  . A fast heartbeat  . A bad rash all over your body  . Dizziness and weakness    Immunizations Administered    Name Date Dose VIS Date Route   Pfizer COVID-19 Vaccine 01/20/2020  8:49 AM 0.3 mL 12/09/2019 Intramuscular   Manufacturer: [place]   Lot: [id number]   [place]: [id number]

## 2020-02-01 DIAGNOSIS — I10 Essential (primary) hypertension: Secondary | ICD-10-CM | POA: Diagnosis not present

## 2020-02-01 DIAGNOSIS — E782 Mixed hyperlipidemia: Secondary | ICD-10-CM | POA: Diagnosis not present

## 2020-02-10 ENCOUNTER — Ambulatory Visit: Payer: Medicare HMO | Attending: Internal Medicine

## 2020-02-10 DIAGNOSIS — Z23 Encounter for immunization: Secondary | ICD-10-CM | POA: Insufficient documentation

## 2020-02-10 NOTE — Progress Notes (Signed)
   Covid-19 Vaccination Clinic  Name:  Katie King    MRN: [id number] DOB: 1951-04-13  02/10/2020  Katie King was observed post Covid-19 immunization for 15 minutes without incidence. She was provided with Vaccine Information Sheet and instruction to access the V-Safe system.   Katie King was instructed to call 911 with any severe reactions post vaccine: Marland Kitchen Difficulty breathing  . Swelling of your face and throat  . A fast heartbeat  . A bad rash all over your body  . Dizziness and weakness    Immunizations Administered    Name Date Dose VIS Date Route   Pfizer COVID-19 Vaccine 02/10/2020 11:03 AM 0.3 mL 12/09/2019 Intramuscular   Manufacturer: [place]   Lot: [id number]   [place]: [id number]

## 2020-03-14 DIAGNOSIS — M25561 Pain in right knee: Secondary | ICD-10-CM | POA: Diagnosis not present

## 2020-04-09 DIAGNOSIS — Z1389 Encounter for screening for other disorder: Secondary | ICD-10-CM | POA: Diagnosis not present

## 2020-04-09 DIAGNOSIS — R7303 Prediabetes: Secondary | ICD-10-CM | POA: Diagnosis not present

## 2020-04-09 DIAGNOSIS — E559 Vitamin D deficiency, unspecified: Secondary | ICD-10-CM | POA: Diagnosis not present

## 2020-04-09 DIAGNOSIS — I10 Essential (primary) hypertension: Secondary | ICD-10-CM | POA: Diagnosis not present

## 2020-04-09 DIAGNOSIS — L309 Dermatitis, unspecified: Secondary | ICD-10-CM | POA: Diagnosis not present

## 2020-04-09 DIAGNOSIS — Z86010 Personal history of colonic polyps: Secondary | ICD-10-CM | POA: Diagnosis not present

## 2020-04-09 DIAGNOSIS — Z0000 Encounter for general adult medical examination without abnormal findings: Secondary | ICD-10-CM | POA: Diagnosis not present

## 2020-04-09 DIAGNOSIS — E782 Mixed hyperlipidemia: Secondary | ICD-10-CM | POA: Diagnosis not present

## 2020-04-09 DIAGNOSIS — L03115 Cellulitis of right lower limb: Secondary | ICD-10-CM | POA: Diagnosis not present

## 2020-05-22 DIAGNOSIS — I10 Essential (primary) hypertension: Secondary | ICD-10-CM | POA: Diagnosis not present

## 2020-05-22 DIAGNOSIS — E782 Mixed hyperlipidemia: Secondary | ICD-10-CM | POA: Diagnosis not present

## 2020-06-22 DIAGNOSIS — Z1231 Encounter for screening mammogram for malignant neoplasm of breast: Secondary | ICD-10-CM | POA: Diagnosis not present

## 2020-08-03 DIAGNOSIS — I10 Essential (primary) hypertension: Secondary | ICD-10-CM | POA: Diagnosis not present

## 2020-08-03 DIAGNOSIS — E782 Mixed hyperlipidemia: Secondary | ICD-10-CM | POA: Diagnosis not present

## 2020-09-13 DIAGNOSIS — R69 Illness, unspecified: Secondary | ICD-10-CM | POA: Diagnosis not present

## 2020-10-17 DIAGNOSIS — H2513 Age-related nuclear cataract, bilateral: Secondary | ICD-10-CM | POA: Diagnosis not present

## 2020-10-17 DIAGNOSIS — H18513 Endothelial corneal dystrophy, bilateral: Secondary | ICD-10-CM | POA: Diagnosis not present

## 2020-10-17 DIAGNOSIS — D23112 Other benign neoplasm of skin of right lower eyelid, including canthus: Secondary | ICD-10-CM | POA: Diagnosis not present

## 2020-10-17 DIAGNOSIS — H524 Presbyopia: Secondary | ICD-10-CM | POA: Diagnosis not present

## 2020-11-02 DIAGNOSIS — R69 Illness, unspecified: Secondary | ICD-10-CM | POA: Diagnosis not present

## 2020-11-06 DIAGNOSIS — H0289 Other specified disorders of eyelid: Secondary | ICD-10-CM | POA: Diagnosis not present

## 2020-11-06 DIAGNOSIS — D481 Neoplasm of uncertain behavior of connective and other soft tissue: Secondary | ICD-10-CM | POA: Diagnosis not present

## 2020-12-15 DIAGNOSIS — Z23 Encounter for immunization: Secondary | ICD-10-CM | POA: Diagnosis not present

## 2020-12-24 ENCOUNTER — Other Ambulatory Visit (HOSPITAL_COMMUNITY): Payer: Self-pay | Admitting: Interventional Radiology

## 2020-12-24 DIAGNOSIS — I671 Cerebral aneurysm, nonruptured: Secondary | ICD-10-CM

## 2021-01-14 ENCOUNTER — Ambulatory Visit (HOSPITAL_COMMUNITY)
Admission: RE | Admit: 2021-01-14 | Discharge: 2021-01-14 | Disposition: A | Discharge status: Home / Self Care | Payer: Medicare HMO | Source: Ambulatory Visit | Attending: Interventional Radiology | Admitting: Interventional Radiology

## 2021-01-14 ENCOUNTER — Other Ambulatory Visit: Payer: Self-pay

## 2021-01-14 DIAGNOSIS — G9389 Other specified disorders of brain: Secondary | ICD-10-CM | POA: Diagnosis not present

## 2021-01-14 DIAGNOSIS — I671 Cerebral aneurysm, nonruptured: Secondary | ICD-10-CM

## 2021-01-14 DIAGNOSIS — Q2739 Arteriovenous malformation, other site: Secondary | ICD-10-CM | POA: Diagnosis not present

## 2021-01-14 MED ORDER — GADOBUTROL 1 MMOL/ML IV SOLN
10.0000 mL | Freq: Once | INTRAVENOUS | Status: AC | PRN
  Administered 2021-01-14: 10 mL via INTRAVENOUS

## 2021-01-16 ENCOUNTER — Telehealth (HOSPITAL_COMMUNITY): Payer: Self-pay

## 2021-01-16 NOTE — Telephone Encounter (Signed)
Pt agreed to f/u in 1 year with mri wo, mrv w/wo. Will fax a copy of report to Dr. Toy Cookey AW

## 2021-02-03 DIAGNOSIS — I10 Essential (primary) hypertension: Secondary | ICD-10-CM | POA: Diagnosis not present

## 2021-02-03 DIAGNOSIS — E782 Mixed hyperlipidemia: Secondary | ICD-10-CM | POA: Diagnosis not present

## 2021-05-21 DIAGNOSIS — Z1389 Encounter for screening for other disorder: Secondary | ICD-10-CM | POA: Diagnosis not present

## 2021-05-21 DIAGNOSIS — Z0000 Encounter for general adult medical examination without abnormal findings: Secondary | ICD-10-CM | POA: Diagnosis not present

## 2021-05-22 DIAGNOSIS — I10 Essential (primary) hypertension: Secondary | ICD-10-CM | POA: Diagnosis not present

## 2021-05-22 DIAGNOSIS — E559 Vitamin D deficiency, unspecified: Secondary | ICD-10-CM | POA: Diagnosis not present

## 2021-05-22 DIAGNOSIS — E782 Mixed hyperlipidemia: Secondary | ICD-10-CM | POA: Diagnosis not present

## 2021-05-22 DIAGNOSIS — R7301 Impaired fasting glucose: Secondary | ICD-10-CM | POA: Diagnosis not present

## 2021-05-22 DIAGNOSIS — R7303 Prediabetes: Secondary | ICD-10-CM | POA: Diagnosis not present

## 2021-05-23 DIAGNOSIS — I10 Essential (primary) hypertension: Secondary | ICD-10-CM | POA: Diagnosis not present

## 2021-05-23 DIAGNOSIS — E782 Mixed hyperlipidemia: Secondary | ICD-10-CM | POA: Diagnosis not present

## 2021-05-30 DIAGNOSIS — E559 Vitamin D deficiency, unspecified: Secondary | ICD-10-CM | POA: Diagnosis not present

## 2021-05-30 DIAGNOSIS — I10 Essential (primary) hypertension: Secondary | ICD-10-CM | POA: Diagnosis not present

## 2021-05-30 DIAGNOSIS — E782 Mixed hyperlipidemia: Secondary | ICD-10-CM | POA: Diagnosis not present

## 2021-06-28 DIAGNOSIS — Z1231 Encounter for screening mammogram for malignant neoplasm of breast: Secondary | ICD-10-CM | POA: Diagnosis not present

## 2021-07-11 DIAGNOSIS — R928 Other abnormal and inconclusive findings on diagnostic imaging of breast: Secondary | ICD-10-CM | POA: Diagnosis not present

## 2021-08-01 DIAGNOSIS — I10 Essential (primary) hypertension: Secondary | ICD-10-CM | POA: Diagnosis not present

## 2021-08-01 DIAGNOSIS — E782 Mixed hyperlipidemia: Secondary | ICD-10-CM | POA: Diagnosis not present

## 2021-08-10 DIAGNOSIS — U071 COVID-19: Secondary | ICD-10-CM | POA: Diagnosis not present

## 2021-08-21 DIAGNOSIS — D225 Melanocytic nevi of trunk: Secondary | ICD-10-CM | POA: Diagnosis not present

## 2021-08-21 DIAGNOSIS — L820 Inflamed seborrheic keratosis: Secondary | ICD-10-CM | POA: Diagnosis not present

## 2021-08-21 DIAGNOSIS — Z1283 Encounter for screening for malignant neoplasm of skin: Secondary | ICD-10-CM | POA: Diagnosis not present

## 2021-08-21 DIAGNOSIS — L308 Other specified dermatitis: Secondary | ICD-10-CM | POA: Diagnosis not present

## 2021-10-14 DIAGNOSIS — H0289 Other specified disorders of eyelid: Secondary | ICD-10-CM | POA: Diagnosis not present

## 2021-10-14 DIAGNOSIS — H0014 Chalazion left upper eyelid: Secondary | ICD-10-CM | POA: Diagnosis not present

## 2021-10-14 DIAGNOSIS — D481 Neoplasm of uncertain behavior of connective and other soft tissue: Secondary | ICD-10-CM | POA: Diagnosis not present

## 2021-10-14 DIAGNOSIS — H1830 Unspecified corneal membrane change: Secondary | ICD-10-CM | POA: Diagnosis not present

## 2021-10-27 DIAGNOSIS — I10 Essential (primary) hypertension: Secondary | ICD-10-CM | POA: Diagnosis not present

## 2021-10-27 DIAGNOSIS — E782 Mixed hyperlipidemia: Secondary | ICD-10-CM | POA: Diagnosis not present

## 2021-11-06 DIAGNOSIS — H18513 Endothelial corneal dystrophy, bilateral: Secondary | ICD-10-CM | POA: Diagnosis not present

## 2021-11-06 DIAGNOSIS — H5203 Hypermetropia, bilateral: Secondary | ICD-10-CM | POA: Diagnosis not present

## 2021-11-06 DIAGNOSIS — H2513 Age-related nuclear cataract, bilateral: Secondary | ICD-10-CM | POA: Diagnosis not present

## 2021-11-06 DIAGNOSIS — R7303 Prediabetes: Secondary | ICD-10-CM | POA: Diagnosis not present

## 2022-02-11 ENCOUNTER — Other Ambulatory Visit (HOSPITAL_COMMUNITY): Payer: Self-pay | Admitting: Interventional Radiology

## 2022-02-17 ENCOUNTER — Other Ambulatory Visit (HOSPITAL_COMMUNITY): Payer: Self-pay | Admitting: Interventional Radiology

## 2022-02-17 DIAGNOSIS — I671 Cerebral aneurysm, nonruptured: Secondary | ICD-10-CM

## 2022-03-03 ENCOUNTER — Other Ambulatory Visit: Payer: Self-pay

## 2022-03-03 ENCOUNTER — Ambulatory Visit (HOSPITAL_COMMUNITY)
Admission: RE | Admit: 2022-03-03 | Discharge: 2022-03-03 | Disposition: A | Discharge status: Home / Self Care | Payer: Medicare HMO | Source: Ambulatory Visit | Attending: Interventional Radiology | Admitting: Interventional Radiology

## 2022-03-03 DIAGNOSIS — I671 Cerebral aneurysm, nonruptured: Secondary | ICD-10-CM | POA: Insufficient documentation

## 2022-03-03 DIAGNOSIS — I770 Arteriovenous fistula, acquired: Secondary | ICD-10-CM | POA: Diagnosis not present

## 2022-03-03 DIAGNOSIS — G9389 Other specified disorders of brain: Secondary | ICD-10-CM | POA: Diagnosis not present

## 2022-03-03 MED ORDER — GADOBUTROL 1 MMOL/ML IV SOLN
10.0000 mL | Freq: Once | INTRAVENOUS | Status: AC | PRN
  Administered 2022-03-03: 10 mL via INTRAVENOUS

## 2022-03-04 ENCOUNTER — Telehealth (HOSPITAL_COMMUNITY): Payer: Self-pay

## 2022-03-04 NOTE — Telephone Encounter (Signed)
Called pt regarding recent imaging, no answer, left vm. AW  

## 2022-03-04 NOTE — Telephone Encounter (Signed)
Pt is not having any sx at this time. Agreed to f/u in 1 year with an mri/mra. AW - Faxed MRI report to [facility] ?

## 2022-05-28 DIAGNOSIS — Z23 Encounter for immunization: Secondary | ICD-10-CM | POA: Diagnosis not present

## 2022-05-28 DIAGNOSIS — Z0000 Encounter for general adult medical examination without abnormal findings: Secondary | ICD-10-CM | POA: Diagnosis not present

## 2022-05-28 DIAGNOSIS — Z1389 Encounter for screening for other disorder: Secondary | ICD-10-CM | POA: Diagnosis not present

## 2022-06-04 DIAGNOSIS — R7301 Impaired fasting glucose: Secondary | ICD-10-CM | POA: Diagnosis not present

## 2022-06-04 DIAGNOSIS — E782 Mixed hyperlipidemia: Secondary | ICD-10-CM | POA: Diagnosis not present

## 2022-06-04 DIAGNOSIS — Z23 Encounter for immunization: Secondary | ICD-10-CM | POA: Diagnosis not present

## 2022-06-04 DIAGNOSIS — E559 Vitamin D deficiency, unspecified: Secondary | ICD-10-CM | POA: Diagnosis not present

## 2022-06-04 DIAGNOSIS — I10 Essential (primary) hypertension: Secondary | ICD-10-CM | POA: Diagnosis not present

## 2022-07-04 DIAGNOSIS — Z1231 Encounter for screening mammogram for malignant neoplasm of breast: Secondary | ICD-10-CM | POA: Diagnosis not present

## 2022-07-04 DIAGNOSIS — Z780 Asymptomatic menopausal state: Secondary | ICD-10-CM | POA: Diagnosis not present

## 2022-07-08 DIAGNOSIS — R928 Other abnormal and inconclusive findings on diagnostic imaging of breast: Secondary | ICD-10-CM | POA: Diagnosis not present

## 2022-09-16 DIAGNOSIS — L308 Other specified dermatitis: Secondary | ICD-10-CM | POA: Diagnosis not present

## 2022-09-16 DIAGNOSIS — D225 Melanocytic nevi of trunk: Secondary | ICD-10-CM | POA: Diagnosis not present

## 2022-09-16 DIAGNOSIS — Z1283 Encounter for screening for malignant neoplasm of skin: Secondary | ICD-10-CM | POA: Diagnosis not present

## 2022-10-21 DIAGNOSIS — D48118 Desmoid tumor of other site: Secondary | ICD-10-CM | POA: Diagnosis not present

## 2022-10-21 DIAGNOSIS — H0289 Other specified disorders of eyelid: Secondary | ICD-10-CM | POA: Diagnosis not present

## 2022-10-21 DIAGNOSIS — H2513 Age-related nuclear cataract, bilateral: Secondary | ICD-10-CM | POA: Diagnosis not present

## 2022-10-21 DIAGNOSIS — H18513 Endothelial corneal dystrophy, bilateral: Secondary | ICD-10-CM | POA: Diagnosis not present

## 2022-11-12 DIAGNOSIS — H18513 Endothelial corneal dystrophy, bilateral: Secondary | ICD-10-CM | POA: Diagnosis not present

## 2022-11-12 DIAGNOSIS — H2513 Age-related nuclear cataract, bilateral: Secondary | ICD-10-CM | POA: Diagnosis not present

## 2022-11-12 DIAGNOSIS — H524 Presbyopia: Secondary | ICD-10-CM | POA: Diagnosis not present

## 2023-02-26 ENCOUNTER — Other Ambulatory Visit (HOSPITAL_COMMUNITY): Payer: Self-pay | Admitting: Interventional Radiology

## 2023-02-26 DIAGNOSIS — I671 Cerebral aneurysm, nonruptured: Secondary | ICD-10-CM

## 2023-03-03 ENCOUNTER — Ambulatory Visit (HOSPITAL_COMMUNITY)
Admission: RE | Admit: 2023-03-03 | Discharge: 2023-03-03 | Disposition: A | Discharge status: Home / Self Care | Payer: Medicare HMO | Source: Ambulatory Visit | Attending: Interventional Radiology | Admitting: Interventional Radiology

## 2023-03-03 DIAGNOSIS — I671 Cerebral aneurysm, nonruptured: Secondary | ICD-10-CM | POA: Insufficient documentation

## 2023-03-03 DIAGNOSIS — G9389 Other specified disorders of brain: Secondary | ICD-10-CM | POA: Diagnosis not present

## 2023-03-06 ENCOUNTER — Other Ambulatory Visit: Payer: Self-pay | Admitting: Student

## 2023-03-11 ENCOUNTER — Telehealth (HOSPITAL_COMMUNITY): Payer: Self-pay

## 2023-03-11 NOTE — Telephone Encounter (Signed)
Pt agreed to f/u in 2 years with an mra head wo. AB  

## 2023-06-10 DIAGNOSIS — R7301 Impaired fasting glucose: Secondary | ICD-10-CM | POA: Diagnosis not present

## 2023-06-10 DIAGNOSIS — Z1389 Encounter for screening for other disorder: Secondary | ICD-10-CM | POA: Diagnosis not present

## 2023-06-10 DIAGNOSIS — R7303 Prediabetes: Secondary | ICD-10-CM | POA: Diagnosis not present

## 2023-06-10 DIAGNOSIS — Z0000 Encounter for general adult medical examination without abnormal findings: Secondary | ICD-10-CM | POA: Diagnosis not present

## 2023-06-10 DIAGNOSIS — Z6835 Body mass index (BMI) 35.0-35.9, adult: Secondary | ICD-10-CM | POA: Diagnosis not present

## 2023-06-10 DIAGNOSIS — I10 Essential (primary) hypertension: Secondary | ICD-10-CM | POA: Diagnosis not present

## 2023-06-10 DIAGNOSIS — E782 Mixed hyperlipidemia: Secondary | ICD-10-CM | POA: Diagnosis not present

## 2023-06-10 DIAGNOSIS — E559 Vitamin D deficiency, unspecified: Secondary | ICD-10-CM | POA: Diagnosis not present

## 2023-07-20 DIAGNOSIS — Z1231 Encounter for screening mammogram for malignant neoplasm of breast: Secondary | ICD-10-CM | POA: Diagnosis not present

## 2023-10-05 DIAGNOSIS — H65491 Other chronic nonsuppurative otitis media, right ear: Secondary | ICD-10-CM | POA: Diagnosis not present

## 2023-10-05 DIAGNOSIS — H6991 Unspecified Eustachian tube disorder, right ear: Secondary | ICD-10-CM | POA: Diagnosis not present

## 2023-10-14 DIAGNOSIS — H0289 Other specified disorders of eyelid: Secondary | ICD-10-CM | POA: Diagnosis not present

## 2023-10-14 DIAGNOSIS — H18513 Endothelial corneal dystrophy, bilateral: Secondary | ICD-10-CM | POA: Diagnosis not present

## 2023-10-14 DIAGNOSIS — H0014 Chalazion left upper eyelid: Secondary | ICD-10-CM | POA: Diagnosis not present

## 2023-10-14 DIAGNOSIS — D48118 Desmoid tumor of other site: Secondary | ICD-10-CM | POA: Diagnosis not present

## 2023-10-14 DIAGNOSIS — H2513 Age-related nuclear cataract, bilateral: Secondary | ICD-10-CM | POA: Diagnosis not present

## 2023-10-16 DIAGNOSIS — Z09 Encounter for follow-up examination after completed treatment for conditions other than malignant neoplasm: Secondary | ICD-10-CM | POA: Diagnosis not present

## 2023-10-16 DIAGNOSIS — K635 Polyp of colon: Secondary | ICD-10-CM | POA: Diagnosis not present

## 2023-10-16 DIAGNOSIS — K5730 Diverticulosis of large intestine without perforation or abscess without bleeding: Secondary | ICD-10-CM | POA: Diagnosis not present

## 2023-10-16 DIAGNOSIS — Z860100 Personal history of colon polyps, unspecified: Secondary | ICD-10-CM | POA: Diagnosis not present

## 2023-10-20 DIAGNOSIS — K635 Polyp of colon: Secondary | ICD-10-CM | POA: Diagnosis not present

## 2023-12-02 DIAGNOSIS — R7303 Prediabetes: Secondary | ICD-10-CM | POA: Diagnosis not present

## 2023-12-02 DIAGNOSIS — H18513 Endothelial corneal dystrophy, bilateral: Secondary | ICD-10-CM | POA: Diagnosis not present

## 2023-12-02 DIAGNOSIS — H524 Presbyopia: Secondary | ICD-10-CM | POA: Diagnosis not present

## 2023-12-02 DIAGNOSIS — H5203 Hypermetropia, bilateral: Secondary | ICD-10-CM | POA: Diagnosis not present

## 2023-12-02 DIAGNOSIS — H2513 Age-related nuclear cataract, bilateral: Secondary | ICD-10-CM | POA: Diagnosis not present

## 2023-12-15 DIAGNOSIS — H6991 Unspecified Eustachian tube disorder, right ear: Secondary | ICD-10-CM | POA: Diagnosis not present

## 2023-12-15 DIAGNOSIS — H90A31 Mixed conductive and sensorineural hearing loss, unilateral, right ear with restricted hearing on the contralateral side: Secondary | ICD-10-CM | POA: Diagnosis not present

## 2023-12-15 DIAGNOSIS — H65491 Other chronic nonsuppurative otitis media, right ear: Secondary | ICD-10-CM | POA: Diagnosis not present

## 2023-12-15 DIAGNOSIS — H90A22 Sensorineural hearing loss, unilateral, left ear, with restricted hearing on the contralateral side: Secondary | ICD-10-CM | POA: Diagnosis not present

## 2024-03-16 DIAGNOSIS — L308 Other specified dermatitis: Secondary | ICD-10-CM | POA: Diagnosis not present

## 2024-03-16 DIAGNOSIS — D225 Melanocytic nevi of trunk: Secondary | ICD-10-CM | POA: Diagnosis not present

## 2024-03-16 DIAGNOSIS — L820 Inflamed seborrheic keratosis: Secondary | ICD-10-CM | POA: Diagnosis not present

## 2024-03-16 DIAGNOSIS — Z1283 Encounter for screening for malignant neoplasm of skin: Secondary | ICD-10-CM | POA: Diagnosis not present

## 2024-06-25 ENCOUNTER — Encounter (HOSPITAL_COMMUNITY): Payer: Self-pay | Admitting: Interventional Radiology

## 2024-06-27 DIAGNOSIS — M25559 Pain in unspecified hip: Secondary | ICD-10-CM | POA: Diagnosis not present

## 2024-06-29 DIAGNOSIS — M25562 Pain in left knee: Secondary | ICD-10-CM | POA: Diagnosis not present

## 2024-07-05 ENCOUNTER — Other Ambulatory Visit (HOSPITAL_COMMUNITY): Payer: Self-pay | Admitting: Interventional Radiology

## 2024-07-05 DIAGNOSIS — I671 Cerebral aneurysm, nonruptured: Secondary | ICD-10-CM

## 2024-07-11 DIAGNOSIS — Z6836 Body mass index (BMI) 36.0-36.9, adult: Secondary | ICD-10-CM | POA: Diagnosis not present

## 2024-07-11 DIAGNOSIS — I671 Cerebral aneurysm, nonruptured: Secondary | ICD-10-CM | POA: Diagnosis not present

## 2024-07-22 DIAGNOSIS — M25562 Pain in left knee: Secondary | ICD-10-CM | POA: Diagnosis not present

## 2024-07-25 DIAGNOSIS — Z1231 Encounter for screening mammogram for malignant neoplasm of breast: Secondary | ICD-10-CM | POA: Diagnosis not present

## 2024-08-16 DIAGNOSIS — M25559 Pain in unspecified hip: Secondary | ICD-10-CM | POA: Diagnosis not present

## 2024-09-01 DIAGNOSIS — M25559 Pain in unspecified hip: Secondary | ICD-10-CM | POA: Diagnosis not present

## 2024-09-06 DIAGNOSIS — M25559 Pain in unspecified hip: Secondary | ICD-10-CM | POA: Diagnosis not present

## 2024-09-14 DIAGNOSIS — B078 Other viral warts: Secondary | ICD-10-CM | POA: Diagnosis not present

## 2024-09-17 DIAGNOSIS — M25559 Pain in unspecified hip: Secondary | ICD-10-CM | POA: Diagnosis not present

## 2024-09-20 DIAGNOSIS — M25559 Pain in unspecified hip: Secondary | ICD-10-CM | POA: Diagnosis not present

## 2024-09-26 DIAGNOSIS — M25559 Pain in unspecified hip: Secondary | ICD-10-CM | POA: Diagnosis not present

## 2024-10-05 DIAGNOSIS — M25559 Pain in unspecified hip: Secondary | ICD-10-CM | POA: Diagnosis not present

## 2024-10-10 DIAGNOSIS — M25559 Pain in unspecified hip: Secondary | ICD-10-CM | POA: Diagnosis not present

## 2024-10-19 DIAGNOSIS — H2513 Age-related nuclear cataract, bilateral: Secondary | ICD-10-CM | POA: Diagnosis not present

## 2024-10-19 DIAGNOSIS — D48118 Desmoid tumor of other site: Secondary | ICD-10-CM | POA: Diagnosis not present

## 2024-10-19 DIAGNOSIS — H18513 Endothelial corneal dystrophy, bilateral: Secondary | ICD-10-CM | POA: Diagnosis not present

## 2024-10-19 DIAGNOSIS — H0289 Other specified disorders of eyelid: Secondary | ICD-10-CM | POA: Diagnosis not present

## 2024-11-02 DIAGNOSIS — M25559 Pain in unspecified hip: Secondary | ICD-10-CM | POA: Diagnosis not present

## 2024-11-11 DIAGNOSIS — M25559 Pain in unspecified hip: Secondary | ICD-10-CM | POA: Diagnosis not present

## 2024-11-14 DIAGNOSIS — M25559 Pain in unspecified hip: Secondary | ICD-10-CM | POA: Diagnosis not present

## 2024-11-21 DIAGNOSIS — M25559 Pain in unspecified hip: Secondary | ICD-10-CM | POA: Diagnosis not present

## 2024-12-14 DIAGNOSIS — H2513 Age-related nuclear cataract, bilateral: Secondary | ICD-10-CM | POA: Diagnosis not present

## 2024-12-14 DIAGNOSIS — H524 Presbyopia: Secondary | ICD-10-CM | POA: Diagnosis not present

## 2024-12-14 DIAGNOSIS — H18513 Endothelial corneal dystrophy, bilateral: Secondary | ICD-10-CM | POA: Diagnosis not present
